# Patient Record
Sex: Female | Born: 1975
Health system: Southern US, Community
[De-identification: ages and names within clinical notes are randomized; demographics above are authoritative.]

## PROBLEM LIST (undated history)

## (undated) DIAGNOSIS — Z8679 Personal history of other diseases of the circulatory system: Secondary | ICD-10-CM

## (undated) DIAGNOSIS — R001 Bradycardia, unspecified: Secondary | ICD-10-CM

## (undated) DIAGNOSIS — K635 Polyp of colon: Secondary | ICD-10-CM

## (undated) DIAGNOSIS — D649 Anemia, unspecified: Secondary | ICD-10-CM

## (undated) DIAGNOSIS — K219 Gastro-esophageal reflux disease without esophagitis: Secondary | ICD-10-CM

## (undated) DIAGNOSIS — Z87898 Personal history of other specified conditions: Secondary | ICD-10-CM

## (undated) DIAGNOSIS — U071 COVID-19: Secondary | ICD-10-CM

## (undated) DIAGNOSIS — M1990 Unspecified osteoarthritis, unspecified site: Secondary | ICD-10-CM

## (undated) DIAGNOSIS — K5790 Diverticulosis of intestine, part unspecified, without perforation or abscess without bleeding: Secondary | ICD-10-CM

## (undated) DIAGNOSIS — B009 Herpesviral infection, unspecified: Secondary | ICD-10-CM

## (undated) DIAGNOSIS — E119 Type 2 diabetes mellitus without complications: Secondary | ICD-10-CM

## (undated) HISTORY — DX: Personal history of other specified conditions: Z87.898

## (undated) HISTORY — DX: Personal history of other diseases of the circulatory system: Z86.79

## (undated) HISTORY — PX: KNEE SURGERY: SHX244

## (undated) HISTORY — PX: KNEE ARTHROSCOPY: SHX127

## (undated) HISTORY — DX: Herpesviral infection, unspecified: B00.9

---

## 2004-06-29 ENCOUNTER — Ambulatory Visit: Payer: Self-pay | Admitting: Rheumatology

## 2004-07-02 ENCOUNTER — Ambulatory Visit: Payer: Self-pay | Admitting: Rheumatology

## 2004-09-07 ENCOUNTER — Emergency Department: Payer: Self-pay | Admitting: Emergency Medicine

## 2004-10-04 ENCOUNTER — Emergency Department: Payer: Self-pay | Admitting: Unknown Physician Specialty

## 2004-10-07 ENCOUNTER — Ambulatory Visit: Payer: Self-pay | Admitting: Gastroenterology

## 2006-06-13 ENCOUNTER — Encounter: Admission: RE | Admit: 2006-06-13 | Discharge: 2006-06-13 | Payer: Self-pay | Admitting: Neurology

## 2006-10-12 IMAGING — CR DG LUMBAR SPINE 2-3V
1 series · 3 of 3 positions shown · non-contrast
Comparison: none

REASON FOR EXAM: fall
COMMENTS:

[Series 1: view not recorded · 0.17mm/px · 3 of 3 slices shown]
[im 1/3]
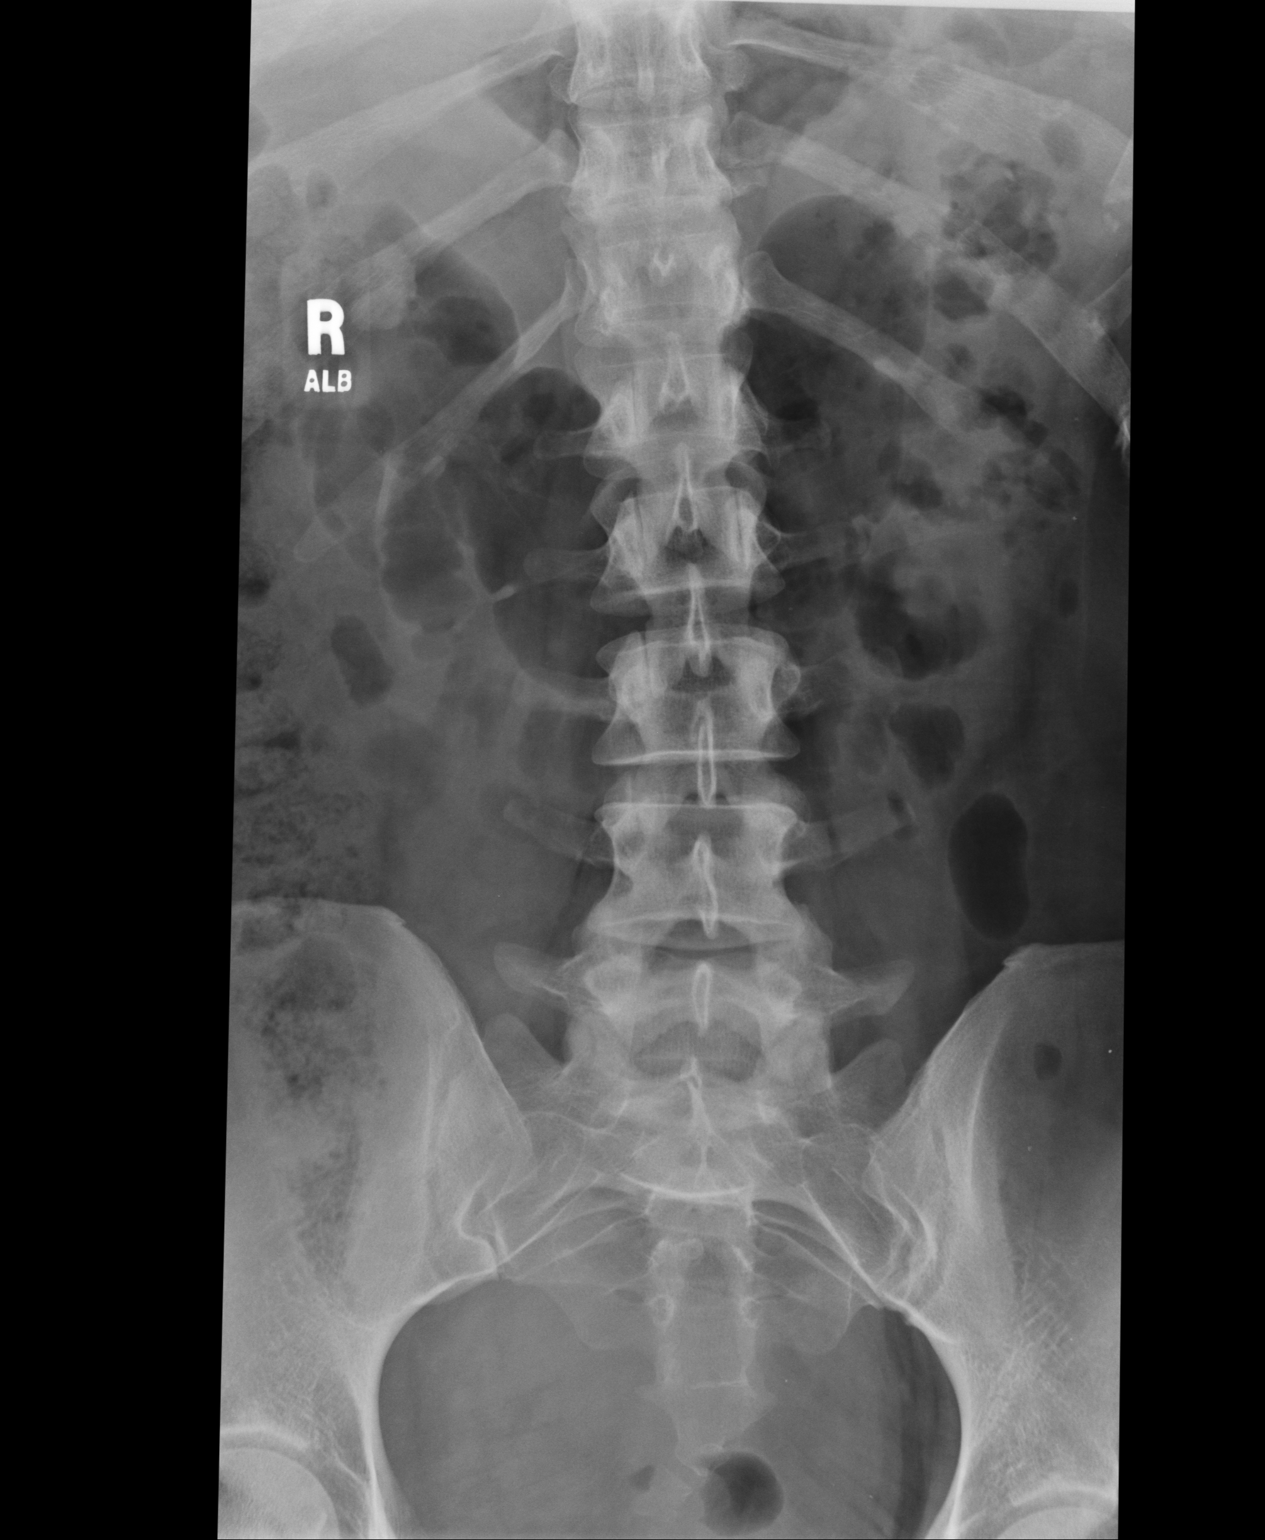
[im 2/3]
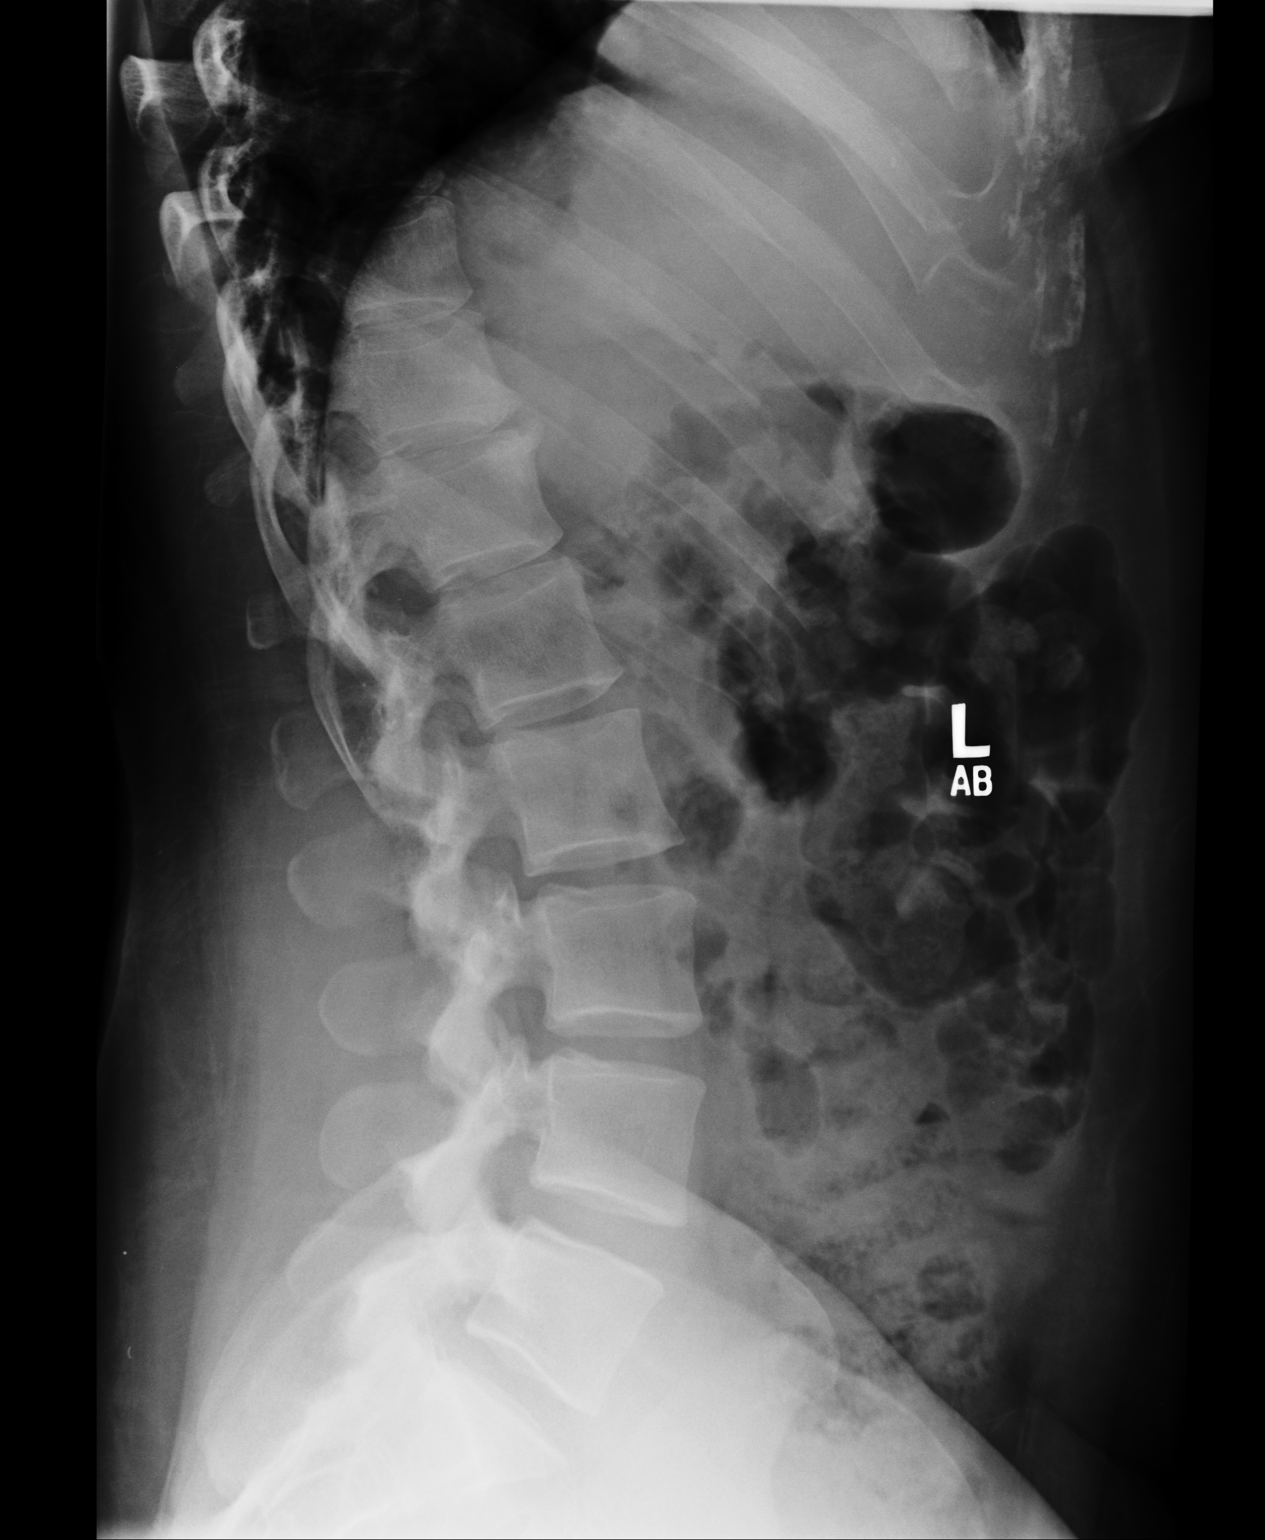
[im 3/3]
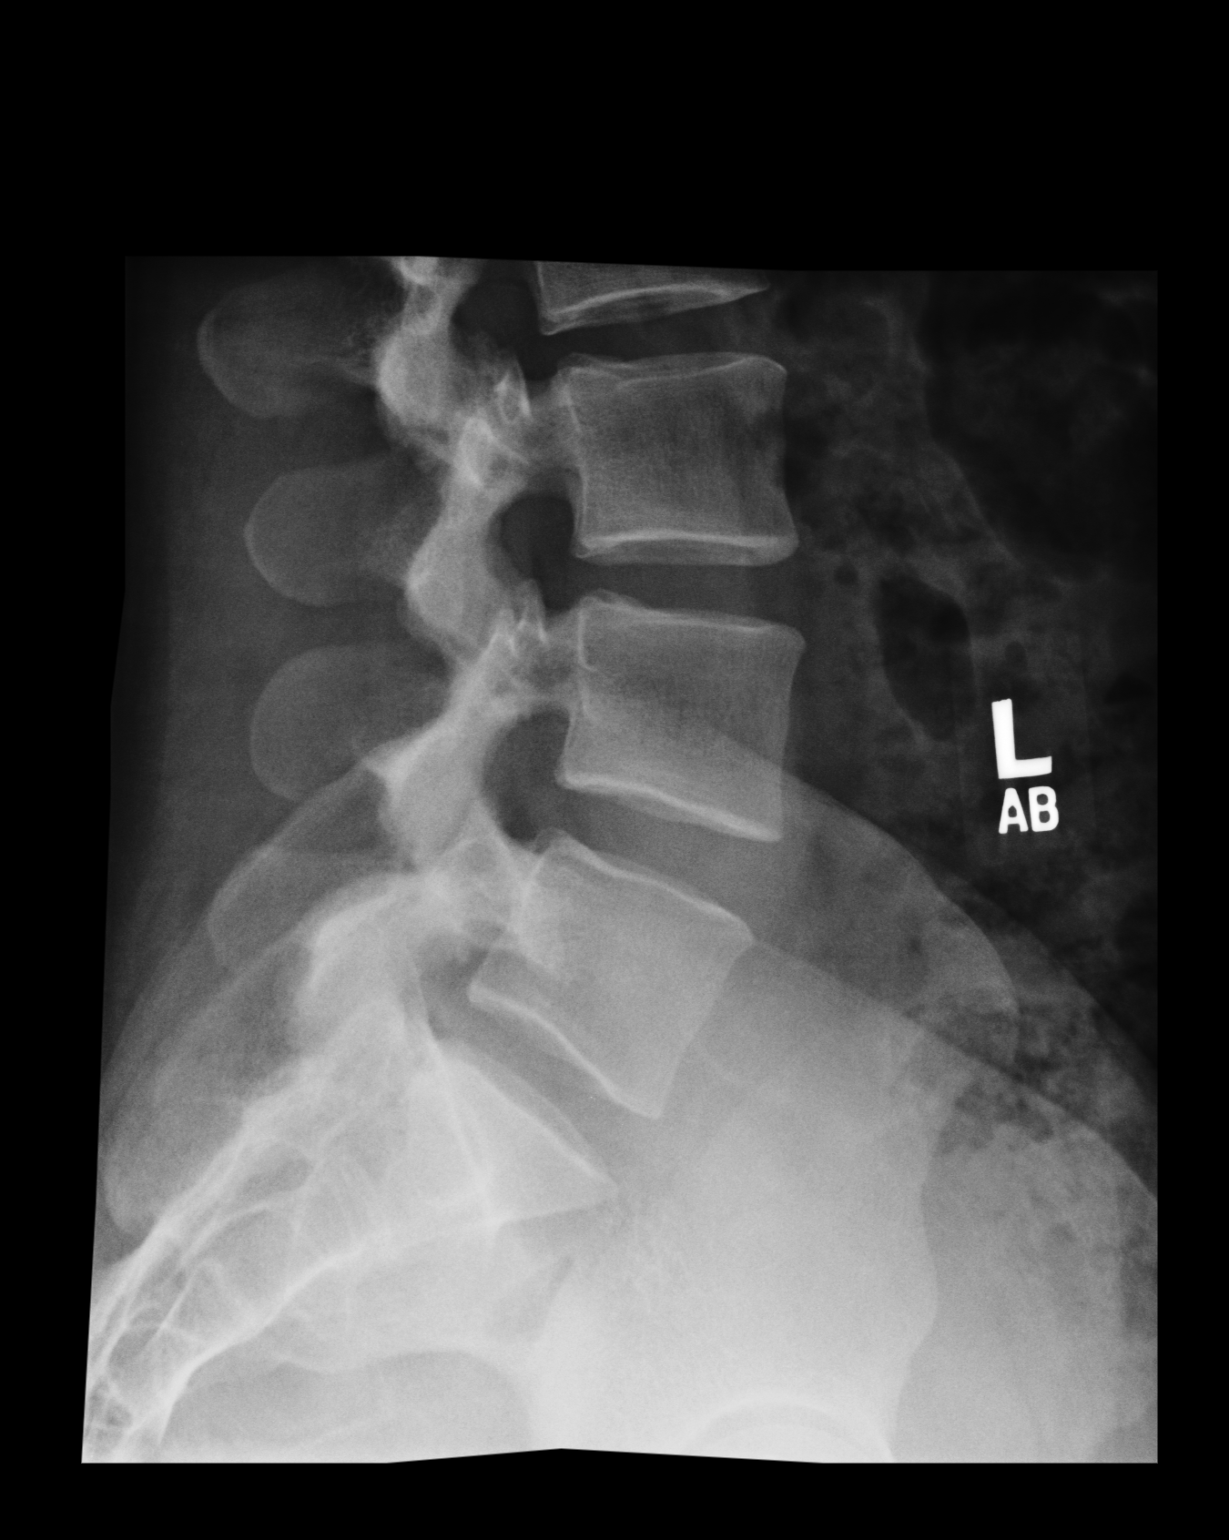

[3 of 3 positions shown; findings below may reference images not displayed]

PROCEDURE:     DXR - DXR LUMBAR SPINE AP AND LATERAL  - October 04, 2004  [DATE]

RESULT:

REASON:  Back pain after a fall.

The lumbar spine aligns anatomically without evidence of fracture, pars
defect or spondylolisthesis.  The intervertebral disc spaces and vertebral
body heights are well maintained. There is no suspicious paraspinal mass.
IMPRESSION: Normal lumbar spine.

## 2006-10-12 IMAGING — CR CERVICAL SPINE - COMPLETE 4+ VIEW
1 series · 9 of 10 positions shown · non-contrast
Comparison: none

REASON FOR EXAM: fall
COMMENTS:

[Series 1: view not recorded · 0.17mm/px · 9 of 11 slices shown]
[im 1/11]
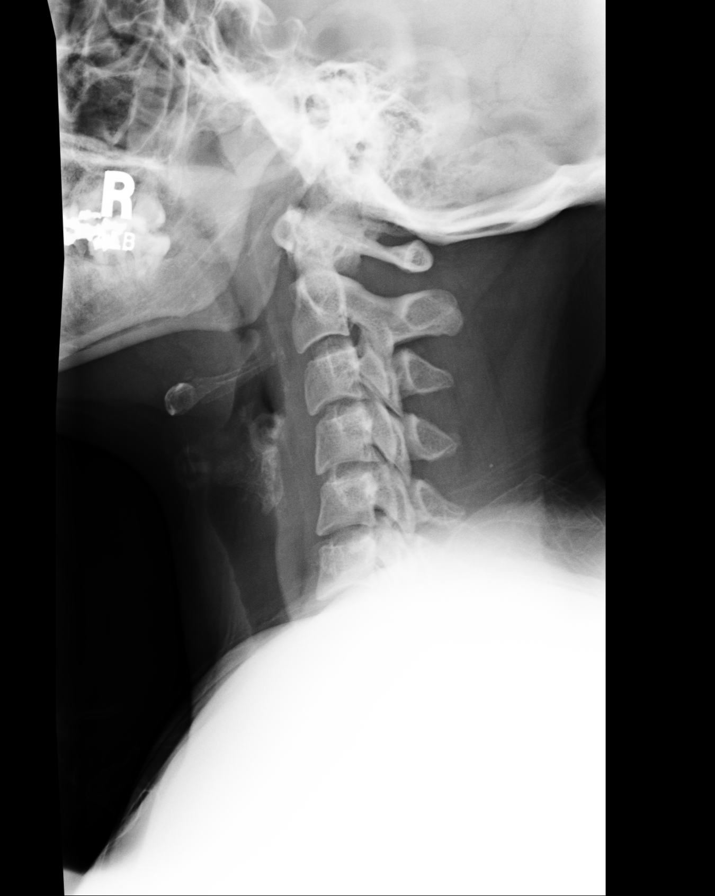
[im 2/11]
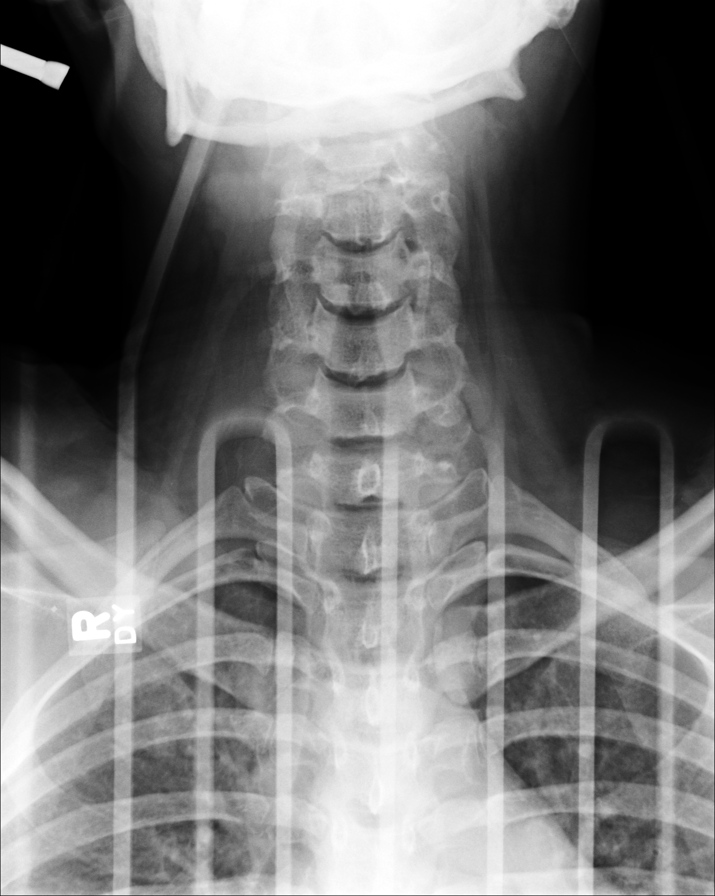
[im 3/11]
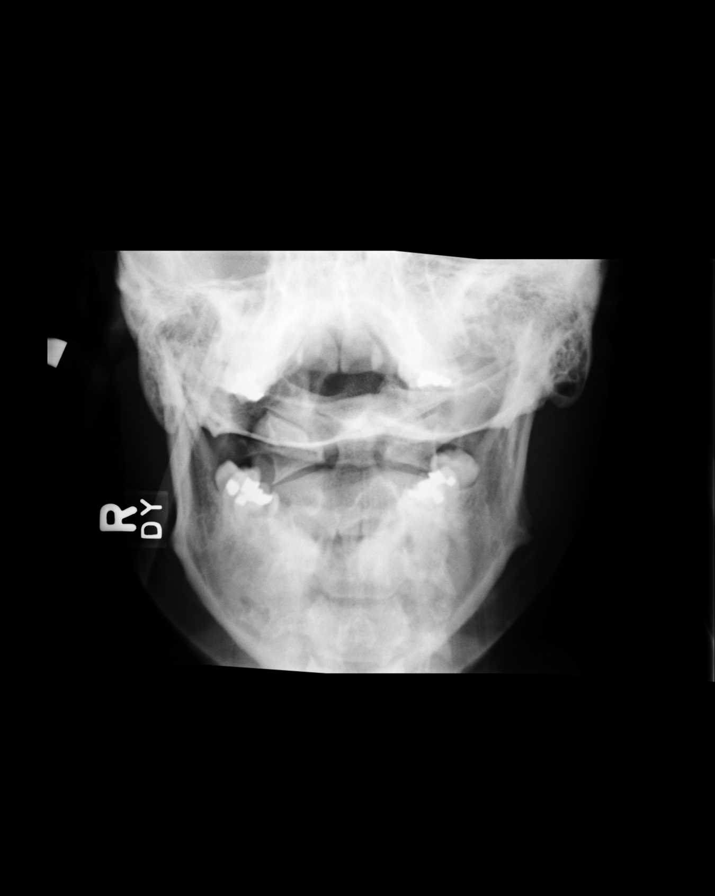
[im 4/11]
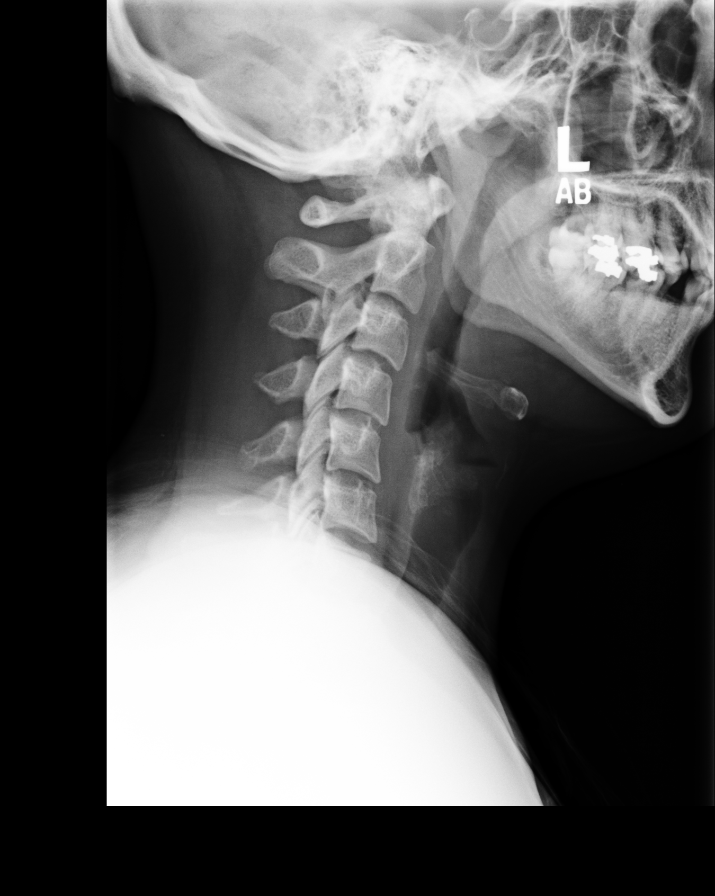
[im 5/11]
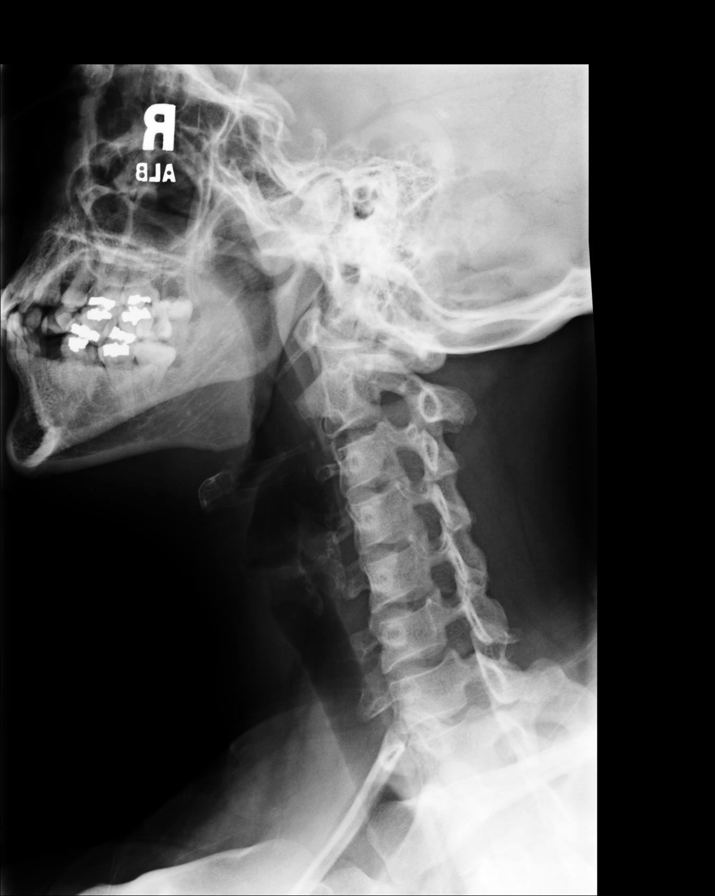
[im 6/11]
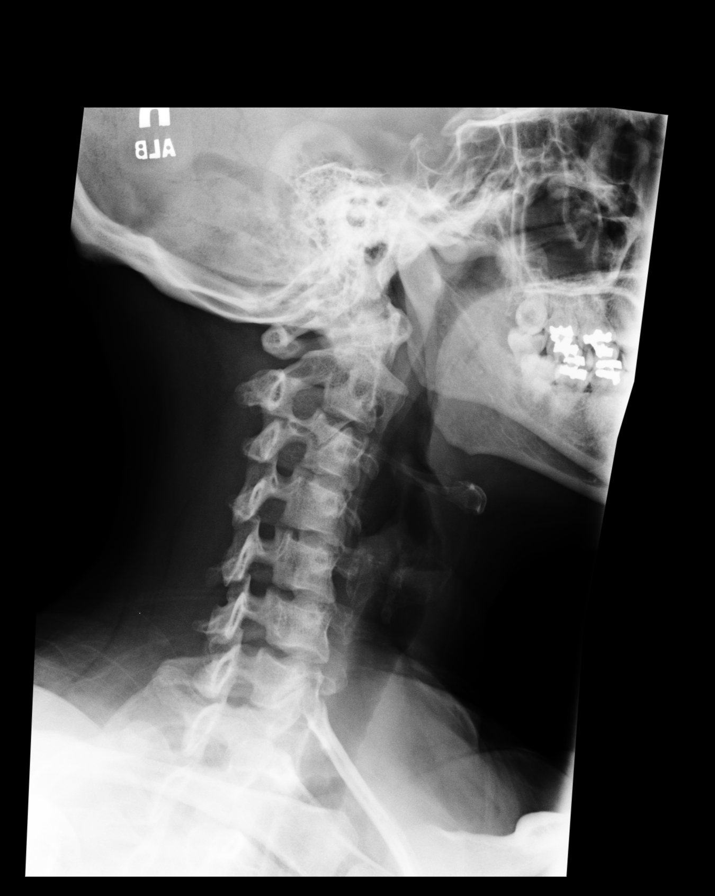
[im 7/11]
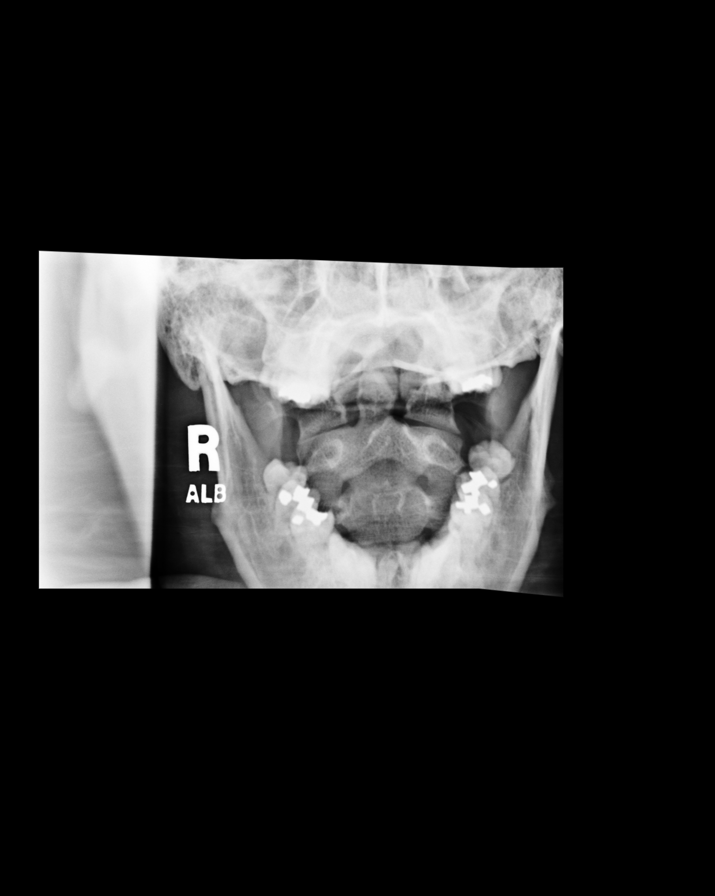
[im 8/11]
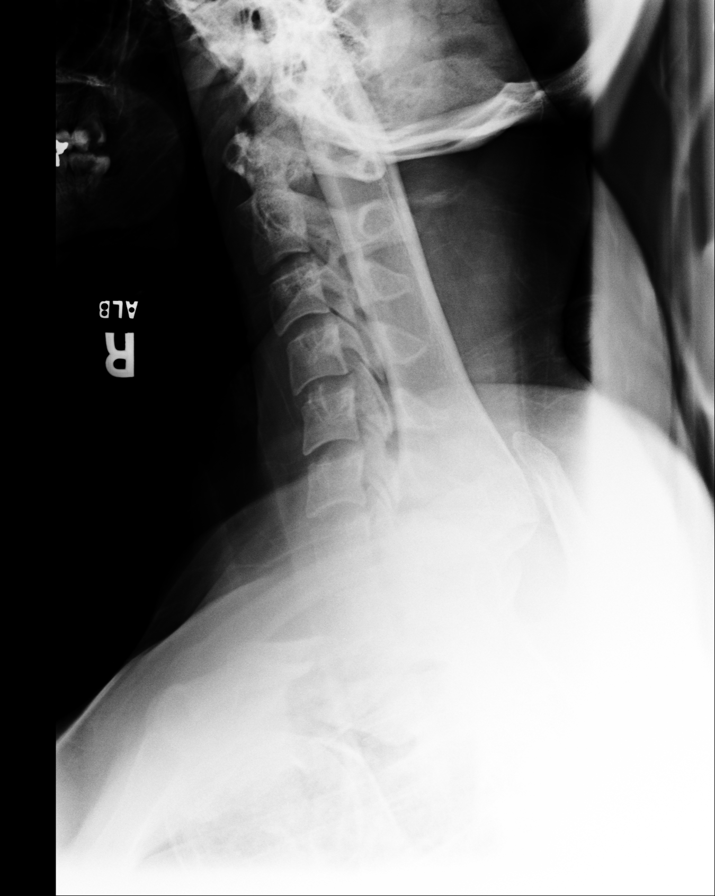
[im 9/11]
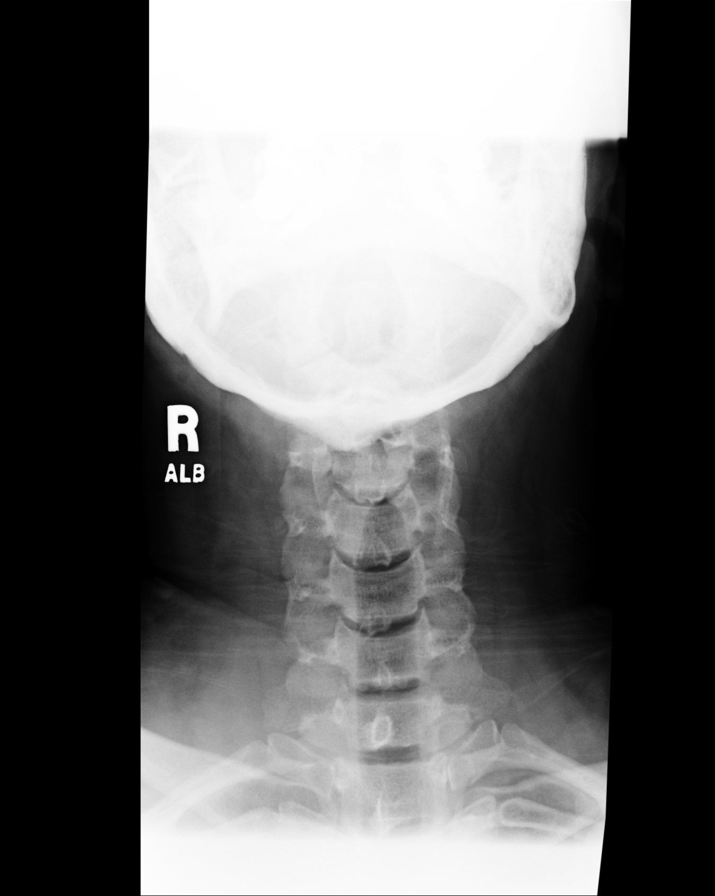

[9 of 10 positions shown; findings below may reference images not displayed]

PROCEDURE:     DXR - DXR C-SPINE COMP TRAUMA W/X-TABL  - October 04, 2004  [DATE]

RESULT:     There is some loss of the natural lordotic curvature of the
cervical spine, which may be positional.  The cervical spine otherwise
aligns anatomically without evidence of fracture. There is a normal
relationship between C7 and T1.  The prevertebral soft tissues and posterior
elements are unremarkable.  The odontoid process is also normal.
IMPRESSION: 1)No acute traumatic abnormality is identified in the cervical spine.

## 2006-10-12 IMAGING — CR DG ANKLE COMPLETE 3+V*L*
1 series · 5 of 5 positions shown · non-contrast
Comparison: none

REASON FOR EXAM: Fall
COMMENTS:

PROCEDURE:     DXR - DXR ANKLE LEFT COMPLETE  - October 04, 2004  [DATE]
RESULT:     Multiple views of the LEFT ankle show no evidence of fracture,
foreign body or soft tissue swelling.

[Series 1: view not recorded · 0.17mm/px · 5 of 5 slices shown]
[im 1/5]
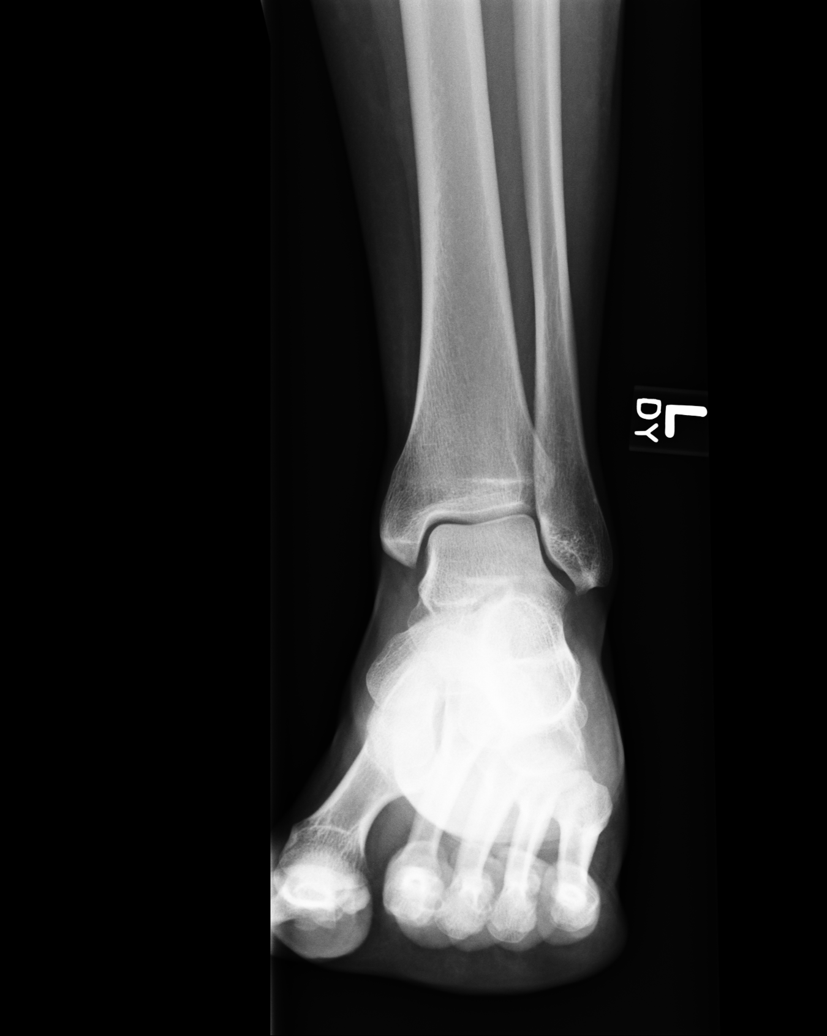
[im 2/5]
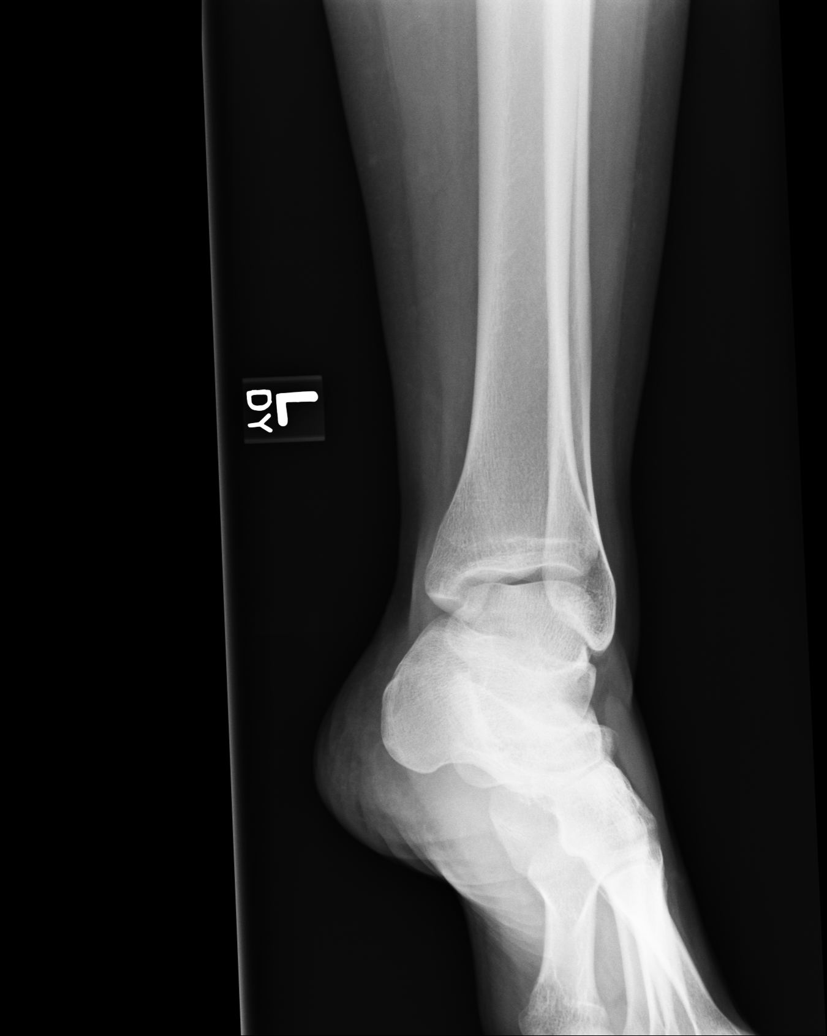
[im 3/5]
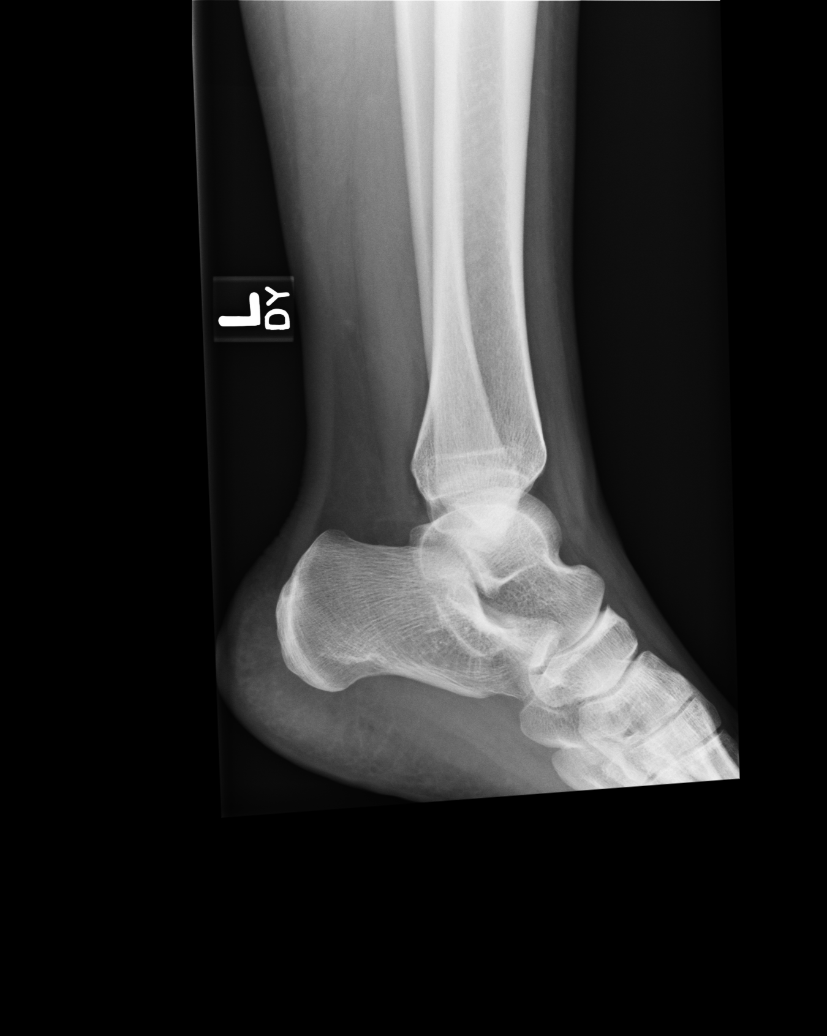
[im 4/5]
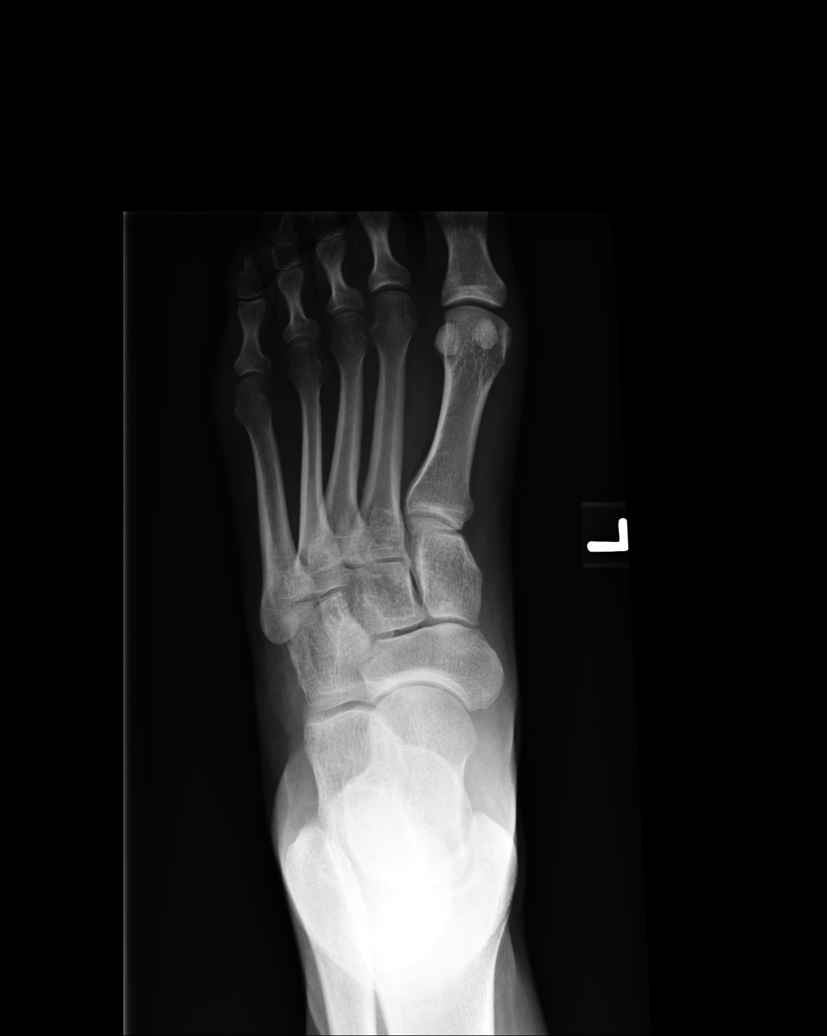
[im 5/5]
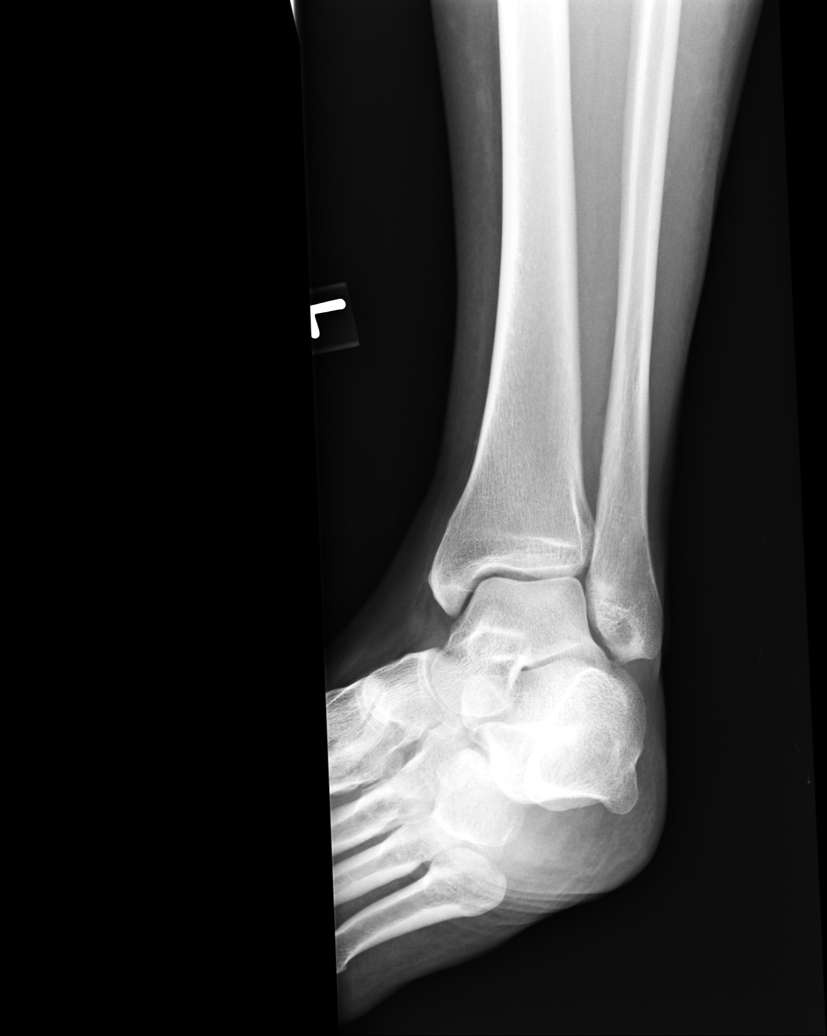

[5 of 5 positions shown; findings below may reference images not displayed]

IMPRESSION: Normal LEFT ankle.

## 2006-10-12 IMAGING — CT CT HEAD WITHOUT CONTRAST
2 series · 16 of 30 positions shown, 20 images · non-contrast
Comparison: none

REASON FOR EXAM: Fell, head pain
COMMENTS:

PROCEDURE:     CT  - CT HEAD WITHOUT CONTRAST  - October 04, 2004  [DATE]
RESULT:     Unenhanced emergent head CT was performed status post trauma.

[Series 2: without · axial · non-contrast · 0.37mm/px · z∈[+954,+1088]mm · 13 of 33 slices shown, 17 images]
[im 3/33  brain]
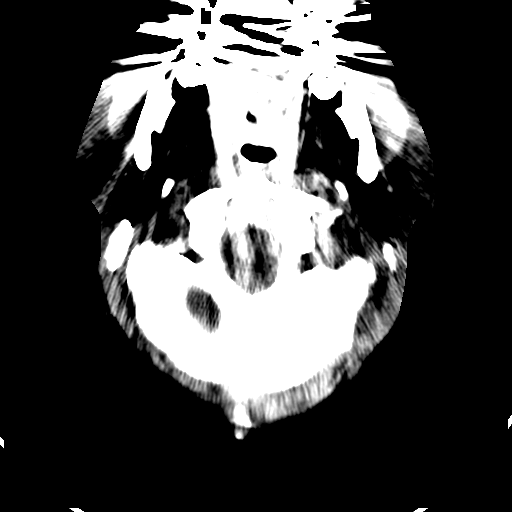
[im 3/33  bone]
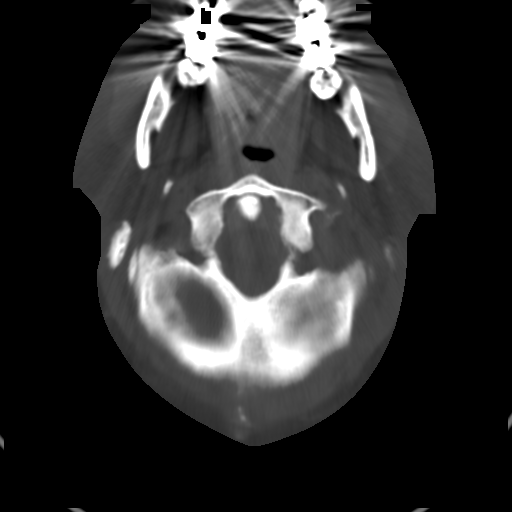
[im 5/33  brain]
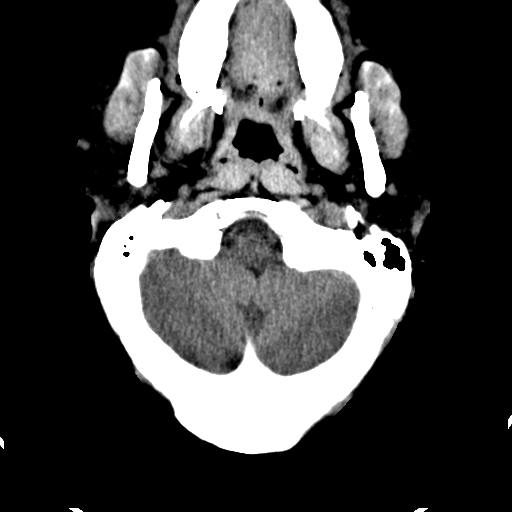
[im 7/33  brain]
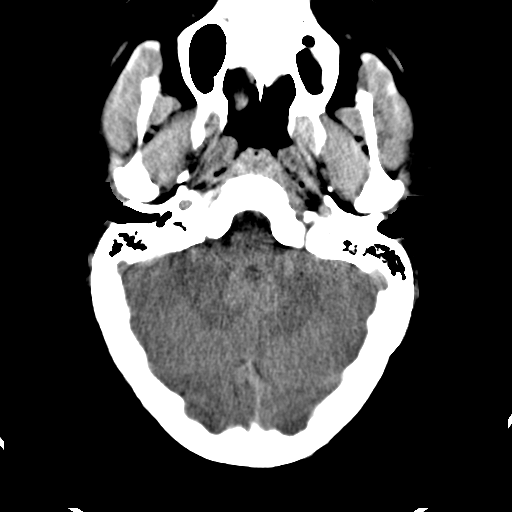
[im 10/33  brain]
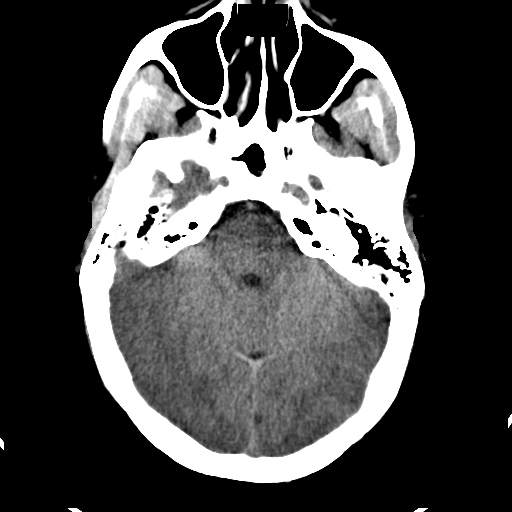
[im 12/33  brain]
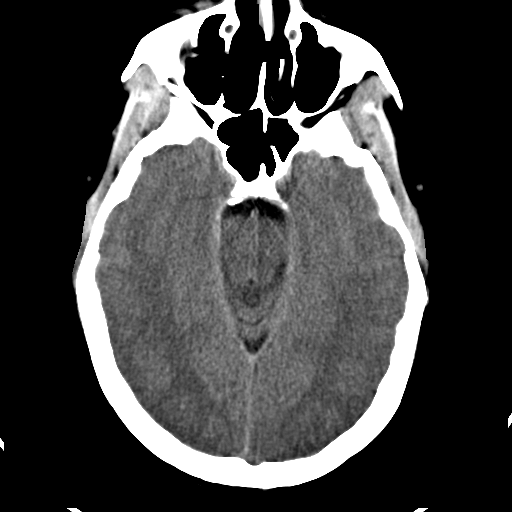
[im 12/33  bone]
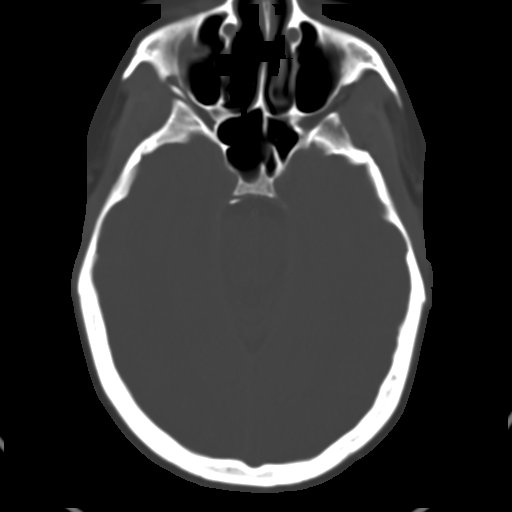
[im 14/33  brain]
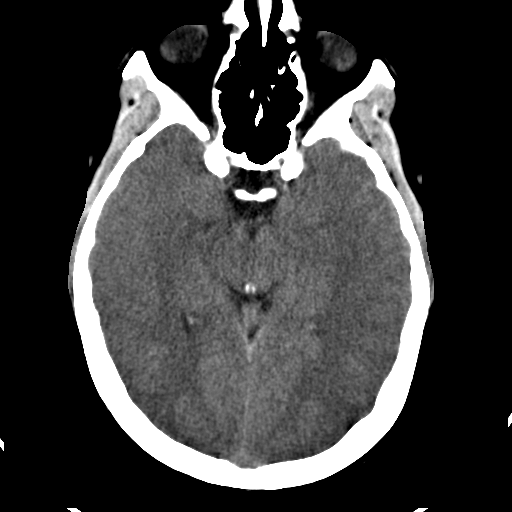
[im 17/33  brain]
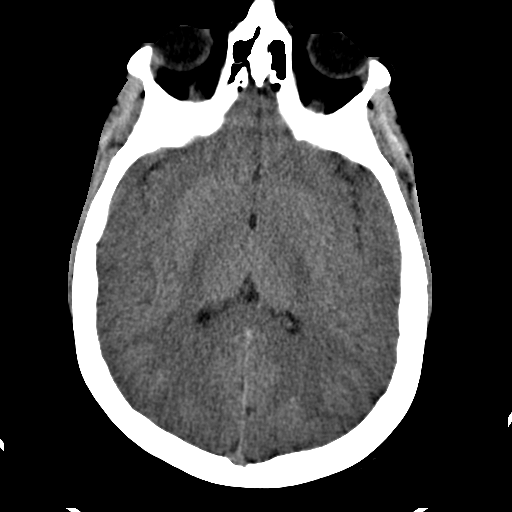
[im 19/33  brain]
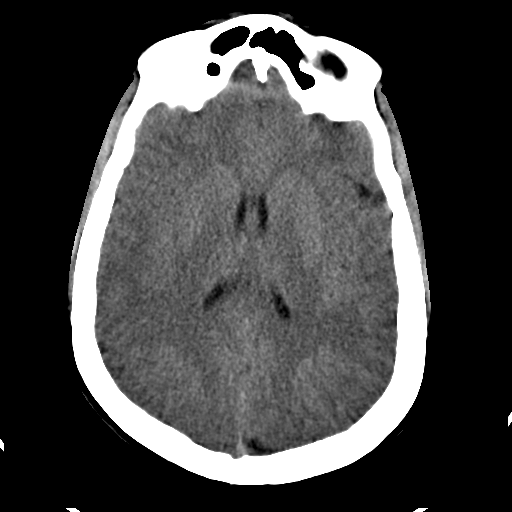
[im 21/33  brain]
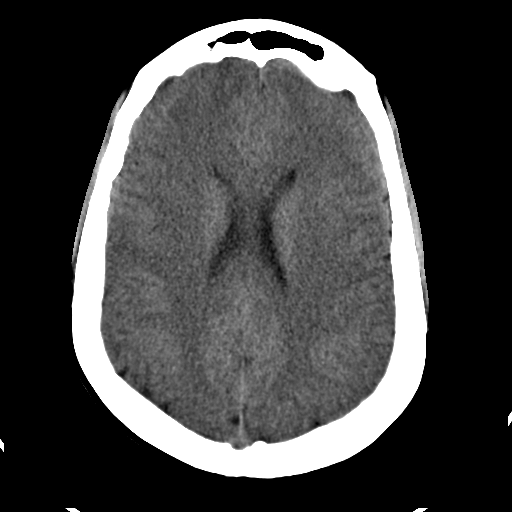
[im 21/33  bone]
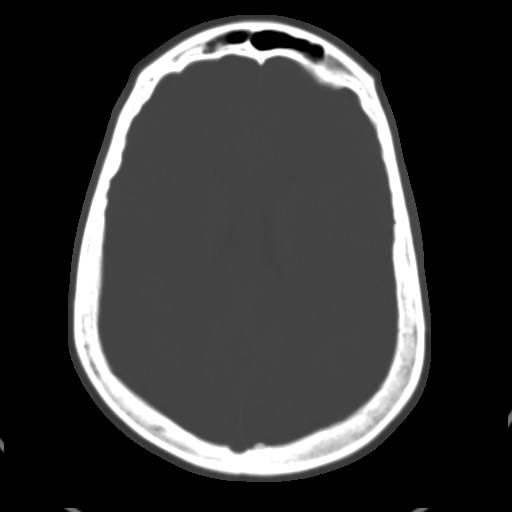
[im 23/33  brain]
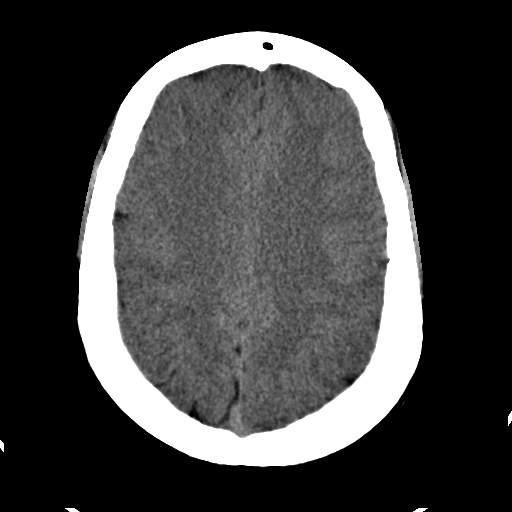
[im 26/33  brain]
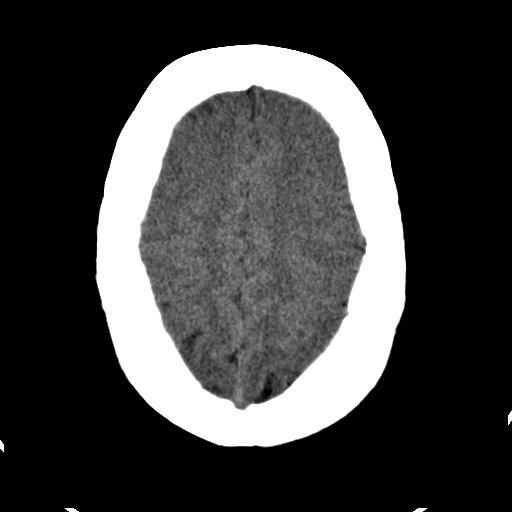
[im 28/33  brain]
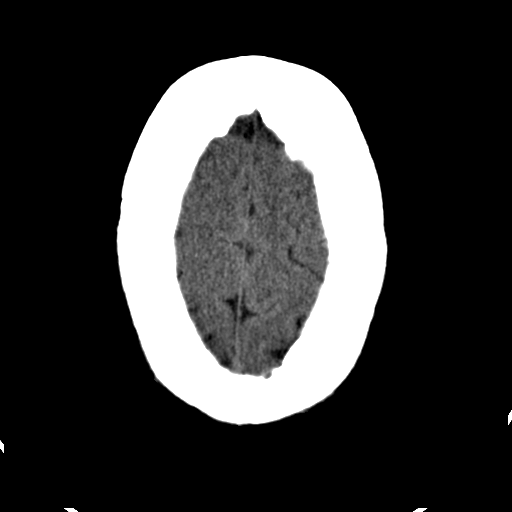
[im 30/33  brain]
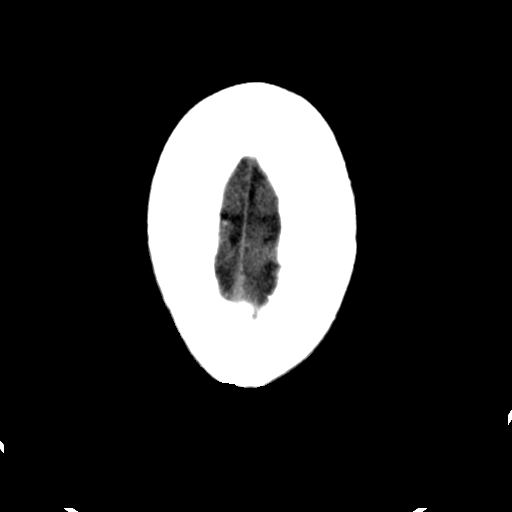
[im 30/33  bone]
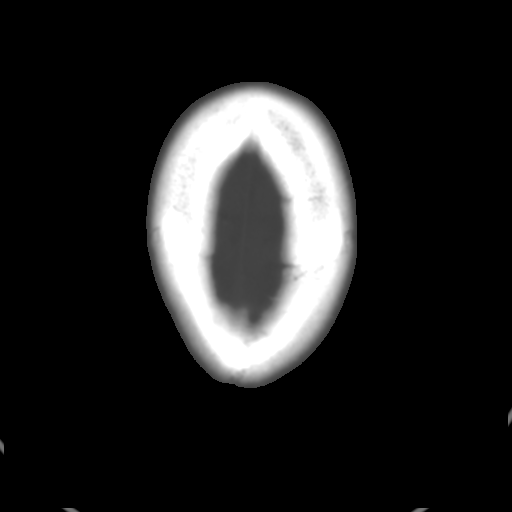

[Series 3: bone windows · axial · 0.39mm/px · z∈[+954,+998]mm · 3 of 33 slices shown]
[im 3/33  bone]
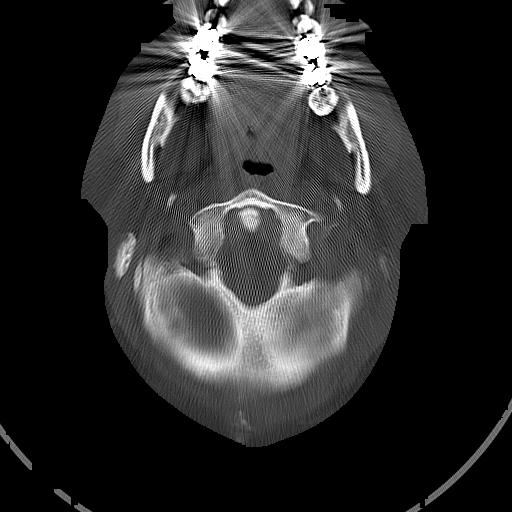
[im 7/33  bone]
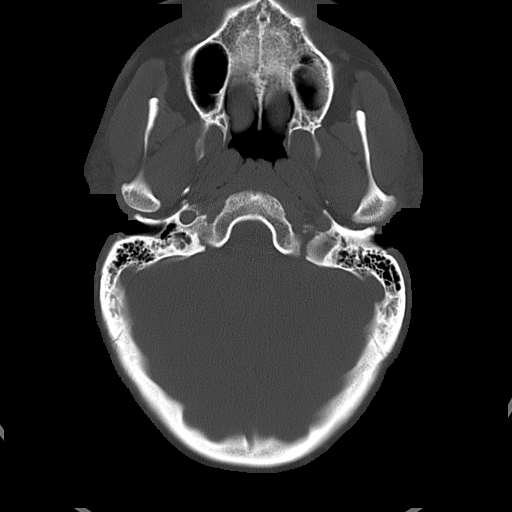
[im 12/33  bone]
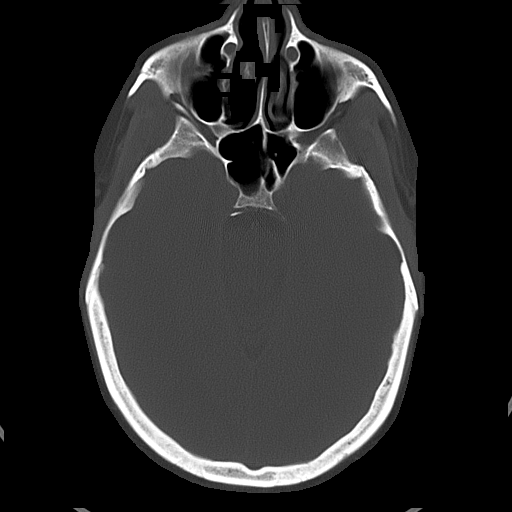

[16 of 30 positions shown; findings below may reference images not displayed]

FINDINGS: No intracerebral bleeds are noted. There is no mass effect. There
is no shift of the midline. No subdural hematomas are seen.

On the bone window settings, no obvious skull fractures are noted.
IMPRESSION: No acute finding is identified on the unenhanced emergent
head CT.

The report was called to the Emergency Room at the conclusion of the study.

## 2010-10-02 ENCOUNTER — Emergency Department: Payer: Self-pay | Admitting: *Deleted

## 2010-10-22 ENCOUNTER — Encounter: Payer: Self-pay | Admitting: Cardiovascular Disease

## 2010-10-22 ENCOUNTER — Ambulatory Visit (INDEPENDENT_AMBULATORY_CARE_PROVIDER_SITE_OTHER): Payer: BC Managed Care – PPO | Admitting: Cardiovascular Disease

## 2010-10-22 DIAGNOSIS — R42 Dizziness and giddiness: Secondary | ICD-10-CM

## 2010-10-22 DIAGNOSIS — R001 Bradycardia, unspecified: Secondary | ICD-10-CM

## 2010-10-22 DIAGNOSIS — I498 Other specified cardiac arrhythmias: Secondary | ICD-10-CM

## 2010-10-22 DIAGNOSIS — R079 Chest pain, unspecified: Secondary | ICD-10-CM

## 2010-10-22 DIAGNOSIS — R5383 Other fatigue: Secondary | ICD-10-CM

## 2010-10-22 DIAGNOSIS — R5381 Other malaise: Secondary | ICD-10-CM

## 2010-10-22 NOTE — Patient Instructions (Addendum)
We will have you wear a holter monitor to evaluate your heart rate in the next 48 hrs We have also scheduled you for a treadmill study. Please call us if you have new issues that need to be addressed.

## 2010-10-23 DIAGNOSIS — R42 Dizziness and giddiness: Secondary | ICD-10-CM | POA: Insufficient documentation

## 2010-10-23 DIAGNOSIS — R001 Bradycardia, unspecified: Secondary | ICD-10-CM | POA: Insufficient documentation

## 2010-10-23 DIAGNOSIS — R0789 Other chest pain: Secondary | ICD-10-CM | POA: Insufficient documentation

## 2010-10-23 DIAGNOSIS — R5383 Other fatigue: Secondary | ICD-10-CM | POA: Insufficient documentation

## 2010-10-23 NOTE — Assessment & Plan Note (Signed)
Uncertain if her fatigue is secondary to bradycardia or some other etiology. She does not sleep well at baseline. She denies sleep apnea.

## 2010-10-23 NOTE — Assessment & Plan Note (Signed)
Etiology of her chest pain is uncertain. We have scheduled her for a treadmill test early next week. Baseline heart rate is slow and we will monitor her chronotropic activity with exercise.

## 2010-10-23 NOTE — Progress Notes (Signed)
   Patient ID: Daisy Ross, female    DOB: 1975-11-13, 35 y.o.   MRN: [id number]  HPI Comments: 35 year old woman with no significant past medical history who typically works out at [employer], presenting with episodes of chest pain and dizziness. She presents for further evaluation.  She reports that over the past 2 weeks, she has had numerous episodes of chest pain, typically associated with exertion. She is able to work out at the gym at baseline now has chest discomfort when she exerts herself. She has had some episodes of chest discomfort at rest though most often with exertion. She describes it as a pressure in her mediastinum radiating to the left, into her shoulder. She does have some of these episodes when she is sexually active.  She's also had episodes of dizziness. Dizziness typically occurs when she stands from a bent over position. She has never been told that her heart rate is low in the past that she has not been seen by physicians on a regular basis.  She did go to the emergency room at [facility] on August 18 for chest pain. EKG was unrevealing, lab work including cardiac enzymes were normal.  EKG shows sinus bradycardia with rate 48 beats per minute with nonspecific ST abnormality in lead 2, 3, aVF  Currently not on medications  Review of Systems  Constitutional: Negative.   HENT: Negative.   Eyes: Negative.   Respiratory: Negative.   Cardiovascular: Positive for chest pain.  Gastrointestinal: Negative.   Musculoskeletal: Negative.   Skin: Negative.   Neurological: Positive for dizziness.  Hematological: Negative.   Psychiatric/Behavioral: Negative.   All other systems reviewed and are negative.    BP 119/83  Pulse 48  Ht 5' 8.5" (1.74 m)  Wt 194 lb (87.998 kg)  BMI 29.07 kg/m2   Physical Exam  Nursing note and vitals reviewed. Constitutional: She is oriented to person, place, and time. She appears well-developed and well-nourished.  HENT:  Head: Normocephalic.    Nose: Nose normal.  Mouth/Throat: Oropharynx is clear and moist.  Eyes: Conjunctivae are normal. Pupils are equal, round, and reactive to light.  Neck: Normal range of motion. Neck supple. No JVD present.  Cardiovascular: Normal rate, regular rhythm, S1 normal, S2 normal, normal heart sounds and intact distal pulses.  Exam reveals no gallop and no friction rub.   No murmur heard. Pulmonary/Chest: Effort normal and breath sounds normal. No respiratory distress. She has no wheezes. She has no rales. She exhibits no tenderness.  Abdominal: Soft. Bowel sounds are normal. She exhibits no distension. There is no tenderness.  Musculoskeletal: Normal range of motion. She exhibits no edema and no tenderness.  Lymphadenopathy:    She has no cervical adenopathy.  Neurological: She is alert and oriented to person, place, and time. Coordination normal.  Skin: Skin is warm and dry. No rash noted. No erythema.  Psychiatric: She has a normal mood and affect. Her behavior is normal. Judgment and thought content normal.         Assessment and Plan

## 2010-10-23 NOTE — Assessment & Plan Note (Signed)
Dizziness typically happens with standing. We have encouraged her to increase her salt intake and fluid intake. She is likely orthostatic with her bradycardia. Blood pressure is low normal.

## 2010-10-23 NOTE — Assessment & Plan Note (Signed)
I suspect that her  Heart rate has been running low on a regular basis. For some reason she is more symptomatic with orthostasis now. Again we will encourage fluids and salt intake appeared we ordered a Holter monitor given she is very symptomatic.

## 2010-10-25 ENCOUNTER — Encounter: Payer: Self-pay | Admitting: Cardiovascular Disease

## 2010-10-25 ENCOUNTER — Ambulatory Visit (INDEPENDENT_AMBULATORY_CARE_PROVIDER_SITE_OTHER): Payer: BC Managed Care – PPO | Admitting: Cardiovascular Disease

## 2010-10-25 DIAGNOSIS — R001 Bradycardia, unspecified: Secondary | ICD-10-CM

## 2010-10-25 DIAGNOSIS — R5383 Other fatigue: Secondary | ICD-10-CM

## 2010-10-25 DIAGNOSIS — I498 Other specified cardiac arrhythmias: Secondary | ICD-10-CM

## 2010-10-25 DIAGNOSIS — R42 Dizziness and giddiness: Secondary | ICD-10-CM

## 2010-10-25 DIAGNOSIS — R079 Chest pain, unspecified: Secondary | ICD-10-CM

## 2010-10-25 NOTE — Progress Notes (Signed)
Exercise Treadmill Test   Treadmill ordered for recent epsiodes of chest pain.  Resting EKG shows NSR with rate of 56 bpm, with no St or T wave ABN Resting blood pressure of 105/73 Stand bruce protocal was used.  Patient exercised for 10 min 35 sec,  Peak heart rate of 171 bpm.  This was 93% of the maximum predicted heart rate (target heart rate 183). Mild symptoms of chest pain at moderate exertion. No  lightheadedness were reported at peak stress or in recovery.  Peak Blood pressure recorded was 160/80 Heart rate at 3 minutes in recovery was 91 bpm.  FINAL IMPRESSION: Normal exercise stress test. No significant EKG changes concerning for ischemia. Excellent exercise tolerance.

## 2010-11-03 ENCOUNTER — Encounter: Payer: BC Managed Care – PPO | Admitting: *Deleted

## 2010-11-08 ENCOUNTER — Telehealth: Payer: Self-pay | Admitting: Cardiovascular Disease

## 2010-11-08 NOTE — Telephone Encounter (Signed)
Rec From [place] ([facility])  11/08/10/km

## 2010-11-08 NOTE — Telephone Encounter (Signed)
Sent to [facility]  11/08/10/km

## 2010-11-11 ENCOUNTER — Telehealth: Payer: Self-pay | Admitting: *Deleted

## 2010-11-11 NOTE — Telephone Encounter (Signed)
Attempted to contact pt with holter monitor results, LMOM TCB. Per Dr. Mariah Milling- NSR with bradycardia while sleeping. Rare APC. She does not have f/u, will discuss with pt upon return call.

## 2010-11-12 NOTE — Telephone Encounter (Signed)
Notified patient of holter monitor results.  Told the patient rare APC.  The patient still having chest pain and would like to follow up with Dr. Mariah Milling.

## 2010-11-12 NOTE — Telephone Encounter (Signed)
Attempted to contact pt, LMOM TCB. She does not have f/u, we were evaluating bradycardia. If she has no c/o do you want her to f/u PRN?

## 2010-11-23 ENCOUNTER — Encounter: Payer: Self-pay | Admitting: Cardiovascular Disease

## 2010-11-23 ENCOUNTER — Ambulatory Visit (INDEPENDENT_AMBULATORY_CARE_PROVIDER_SITE_OTHER): Payer: BC Managed Care – PPO | Admitting: Cardiovascular Disease

## 2010-11-23 VITALS — BP 109/77 | HR 56 | Ht 69.0 in | Wt 192.0 lb

## 2010-11-23 DIAGNOSIS — R42 Dizziness and giddiness: Secondary | ICD-10-CM

## 2010-11-23 DIAGNOSIS — R5381 Other malaise: Secondary | ICD-10-CM

## 2010-11-23 DIAGNOSIS — R5383 Other fatigue: Secondary | ICD-10-CM

## 2010-11-23 DIAGNOSIS — R001 Bradycardia, unspecified: Secondary | ICD-10-CM

## 2010-11-23 DIAGNOSIS — I498 Other specified cardiac arrhythmias: Secondary | ICD-10-CM

## 2010-11-23 DIAGNOSIS — R079 Chest pain, unspecified: Secondary | ICD-10-CM

## 2010-11-23 MED ORDER — FLUDROCORTISONE ACETATE 0.1 MG PO TABS: 0.1000 mg | ORAL_TABLET | Freq: Every day | ORAL | Status: AC

## 2010-11-23 NOTE — Assessment & Plan Note (Signed)
Etiology of her fatigue and headaches is uncertain. Uncertain if she is having poor sleep on a regular basis. Uncertain if there are other issues involved such as stress or depression. Unable to exclude hypotension as the cause of her headaches.

## 2010-11-23 NOTE — Assessment & Plan Note (Signed)
She does have bradycardia and appears to be relatively asymptomatic though her blood pressures also low. We will try the Florinef as mentioned for low blood pressure and symptomatic dizziness.

## 2010-11-23 NOTE — Assessment & Plan Note (Signed)
Chest pain is atypical, short, likely noncardiac given her normal treadmill

## 2010-11-23 NOTE — Assessment & Plan Note (Signed)
Etiology of her dizziness is concerning for hypotension in the setting of bradycardia. Holter does not show a found arrhythmia despite her sensation of something fluttering while wearing the Holter. Symptoms could be secondary to APCs though these were rare. We have suggested that we try to modify her blood pressure to see if we are able to alleviate her dizziness. We will try Florinef 0.1 mg 3 times a week, increasing to daily if needed to push her blood pressure slightly higher.

## 2010-11-23 NOTE — Patient Instructions (Signed)
You are doing well. Please start the florinef three times a week for two weeks and then increase to daily. Monitor your blood pressure and heart rate.  Please call us if you have new issues that need to be addressed before your next appt.  Call us with blood pressure numbers and heart rate numbers before setting up another appt.

## 2010-11-23 NOTE — Progress Notes (Signed)
Patient ID: Daisy Ross, female    DOB: [birth year], 35 y.o.   MRN: [id number]  HPI Comments: 35 year old woman with no significant past medical history who typically works out at [employer], presenting for follow up of episodes of chest pain and dizziness.  She had a recent treadmill study that was essentially normal with good exercise tolerance. She did have a chest discomfort at peak exercise though not significant. She did go to the emergency room at [facility] on August 18 for chest pain. EKG was unrevealing, lab work including cardiac enzymes were normal.  She had a Holter monitor that showed normal sinus rhythm with rare APC.  She reports that she continues to have a fluttering in her chest followed by dizziness. She also has episodes of chest pain. Uncertain if the chest discomfort is secondary to her fluttering. 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 She is quite symptomatic from this. She continues to have long headaches, up to several days at a time. She does not sleep well secondary to her job and sleeps for 3 hours at a time per her report. The  Holter showed that she slept reasonably through the night with heart rates in the 40s, increasing to 60 to 70 at nighttime.  She is able to work out at [employer].  She's also had episodes of dizziness. Dizziness typically occurs when she stands from a bent over position. She has never been told that her heart rate is low in the past that she has not been seen by physicians on a regular basis.    EKG shows sinus bradycardia with rate 61 beats per minute with no significant ST or T wave changes  Outpatient Encounter Prescriptions as of 11/23/2010  Medication Sig Dispense Refill   No on medications   Review of Systems  Constitutional: Negative.   HENT: Negative.   Eyes: Negative.   Respiratory: Negative.   Cardiovascular: Positive for chest pain.  Gastrointestinal: Negative.     Musculoskeletal: Negative.   Skin: Negative.   Neurological: Positive for dizziness.  Hematological: Negative.   Psychiatric/Behavioral: Negative.   All other systems reviewed and are negative.    BP 109/77  Pulse 56  Ht 5\' 9"  (1.753 m)  Wt 192 lb (87.091 kg)  BMI 28.35 kg/m2   Physical Exam  Nursing note and vitals reviewed. Constitutional: She is oriented to person, place, and time. She appears well-developed and well-nourished.  HENT:  Head: Normocephalic.  Nose: Nose normal.  Mouth/Throat: Oropharynx is clear and moist.  Eyes: Conjunctivae are normal. Pupils are equal, round, and reactive to light.  Neck: Normal range of motion. Neck supple. No JVD present.  Cardiovascular: Normal rate, regular rhythm, S1 normal, S2 normal, normal heart sounds and intact distal pulses.  Exam reveals no gallop and no friction rub.   No murmur heard. Pulmonary/Chest: Effort normal and breath sounds normal. No respiratory distress. She has no wheezes. She has no rales. She exhibits no tenderness.  Abdominal: Soft. Bowel sounds are normal. She exhibits no distension. There is no tenderness.  Musculoskeletal: Normal range of motion. She exhibits no edema and no tenderness.  Lymphadenopathy:    She has no cervical adenopathy.  Neurological: She is alert and oriented to person, place, and time. Coordination normal.  Skin: Skin is warm and dry. No rash noted. No erythema.  Psychiatric: She has a normal mood and affect. Her behavior is normal.  Judgment and thought content normal.         Assessment and Plan

## 2011-02-02 ENCOUNTER — Ambulatory Visit: Payer: Self-pay

## 2012-03-06 ENCOUNTER — Emergency Department: Payer: Self-pay | Admitting: Emergency Medicine

## 2012-03-06 LAB — COMPREHENSIVE METABOLIC PANEL
Albumin: 3.8 g/dL (ref 3.4–5.0)
Alkaline Phosphatase: 76 U/L (ref 50–136)
Anion Gap: 5 — ABNORMAL LOW (ref 7–16)
BUN: 12 mg/dL (ref 7–18)
Bilirubin,Total: 0.2 mg/dL (ref 0.2–1.0)
Calcium, Total: 8.7 mg/dL (ref 8.5–10.1)
Chloride: 108 mmol/L — ABNORMAL HIGH (ref 98–107)
Co2: 26 mmol/L (ref 21–32)
Creatinine: 0.71 mg/dL (ref 0.60–1.30)
EGFR (African American): >60
EGFR (Non-African Amer.): >60
Glucose: 93 mg/dL (ref 65–99)
Osmolality: 277 (ref 275–301)
Potassium: 3.7 mmol/L (ref 3.5–5.1)
SGOT(AST): 33 U/L (ref 15–37)
SGPT (ALT): 48 U/L (ref 12–78)
Sodium: 139 mmol/L (ref 136–145)
Total Protein: 8.6 g/dL — ABNORMAL HIGH (ref 6.4–8.2)

## 2012-03-06 LAB — URINALYSIS, COMPLETE
Bacteria: NONE SEEN
Bilirubin,UR: NEGATIVE
Glucose,UR: 150 mg/dL (ref 0–75)
Ketone: NEGATIVE
Leukocyte Esterase: NEGATIVE
Nitrite: NEGATIVE
Ph: 6 (ref 4.5–8.0)
Protein: NEGATIVE
RBC,UR: 1 /HPF (ref 0–5)
Specific Gravity: 1.023 (ref 1.003–1.030)
Squamous Epithelial: 1
WBC UR: 2 /HPF (ref 0–5)

## 2012-03-06 LAB — CBC
HCT: 41 % (ref 35.0–47.0)
HGB: 13.4 g/dL (ref 12.0–16.0)
MCH: 29.1 pg (ref 26.0–34.0)
MCHC: 32.6 g/dL (ref 32.0–36.0)
MCV: 89 fL (ref 80–100)
Platelet: 297 10*3/uL (ref 150–440)
RBC: 4.59 10*6/uL (ref 3.80–5.20)
RDW: 12.7 % (ref 11.5–14.5)
WBC: 7.5 10*3/uL (ref 3.6–11.0)

## 2012-03-06 LAB — LIPASE, BLOOD: Lipase: 157 U/L (ref 73–393)

## 2012-03-06 LAB — PREGNANCY, URINE: Pregnancy Test, Urine: NEGATIVE m[IU]/mL

## 2012-08-15 ENCOUNTER — Encounter: Payer: Self-pay | Admitting: Cardiovascular Disease

## 2012-08-15 ENCOUNTER — Ambulatory Visit (INDEPENDENT_AMBULATORY_CARE_PROVIDER_SITE_OTHER): Payer: BC Managed Care – PPO | Admitting: Cardiovascular Disease

## 2012-08-15 VITALS — BP 132/82 | HR 62 | Ht 68.0 in | Wt 198.5 lb

## 2012-08-15 DIAGNOSIS — I498 Other specified cardiac arrhythmias: Secondary | ICD-10-CM

## 2012-08-15 DIAGNOSIS — Z01810 Encounter for preprocedural cardiovascular examination: Secondary | ICD-10-CM | POA: Insufficient documentation

## 2012-08-15 DIAGNOSIS — R001 Bradycardia, unspecified: Secondary | ICD-10-CM

## 2012-08-15 DIAGNOSIS — R079 Chest pain, unspecified: Secondary | ICD-10-CM

## 2012-08-15 NOTE — Patient Instructions (Addendum)
You are doing well. No medication changes were made.  Please call us if you have new issues that need to be addressed before your next appt.    

## 2012-08-15 NOTE — Assessment & Plan Note (Signed)
Overall she is doing well. No symptoms concerning for new cardiac issues. Rare palpitation. Otherwise stable. No further testing needed. She would be acceptable risk for any orthopedic surgery needed.

## 2012-08-15 NOTE — Progress Notes (Signed)
   Patient ID: Daisy Ross, female    DOB: May 16, 1975, 37 y.o.   MRN: [id number]  HPI Comments: 37 year old woman with no significant past cardiac history, previously evaluated for chest pain and dizziness, who presents for preoperative evaluation prior to knee surgery on the right. She has a history of surgery to her right knee 20 years ago  Previous treadmill study done for chest pain that was essentially normal with good exercise tolerance. She did have a chest discomfort at peak exercise though not significant. She did go to the emergency room at [facility] in August  2012  for chest pain. EKG was unrevealing, lab work including cardiac enzymes were normal.  She had a Holter monitor that showed normal sinus rhythm with rare APC.  She does have occasional palpitations. No significant chest pain.   Previous  Holter showed that when she slept, she had heart rates in the 40s, increasing to 60 to 70 while awake She is able to work out at [employer], limited by her knee pain Recently seen by [facility] orthopedics for her knee  EKG shows normal sinus rhythm with rate 62 beats per minute beats per minute with no significant ST or T wave changes  No outpatient encounter prescriptions on file as of 08/15/2012.      Review of Systems  Constitutional: Negative.   HENT: Negative.   Eyes: Negative.   Respiratory: Negative.   Gastrointestinal: Negative.   Musculoskeletal: Positive for arthralgias.  Skin: Negative.   Psychiatric/Behavioral: Negative.   All other systems reviewed and are negative.    BP 132/82  Pulse 62  Ht 5\' 8"  (1.727 m)  Wt 198 lb 8 oz (90.039 kg)  BMI 30.19 kg/m2  Physical Exam  Nursing note and vitals reviewed. Constitutional: She is oriented to person, place, and time. She appears well-developed and well-nourished.  HENT:  Head: Normocephalic.  Nose: Nose normal.  Mouth/Throat: Oropharynx is clear and moist.  Eyes: Conjunctivae are normal. Pupils are equal, round, and  reactive to light.  Neck: Normal range of motion. Neck supple. No JVD present.  Cardiovascular: Normal rate, regular rhythm, S1 normal, S2 normal, normal heart sounds and intact distal pulses.  Exam reveals no gallop and no friction rub.   No murmur heard. Pulmonary/Chest: Effort normal and breath sounds normal. No respiratory distress. She has no wheezes. She has no rales. She exhibits no tenderness.  Abdominal: Soft. Bowel sounds are normal. She exhibits no distension. There is no tenderness.  Musculoskeletal: Normal range of motion. She exhibits no edema and no tenderness.  Lymphadenopathy:    She has no cervical adenopathy.  Neurological: She is alert and oriented to person, place, and time. Coordination normal.  Skin: Skin is warm and dry. No rash noted. No erythema.  Psychiatric: She has a normal mood and affect. Her behavior is normal. Judgment and thought content normal.    Assessment and Plan

## 2012-11-01 ENCOUNTER — Ambulatory Visit: Payer: Self-pay | Admitting: Medical

## 2012-12-10 ENCOUNTER — Ambulatory Visit: Payer: Self-pay | Admitting: *Deleted

## 2012-12-10 ENCOUNTER — Encounter: Payer: Self-pay | Admitting: *Deleted

## 2012-12-10 ENCOUNTER — Ambulatory Visit (INDEPENDENT_AMBULATORY_CARE_PROVIDER_SITE_OTHER): Payer: BC Managed Care – PPO | Admitting: *Deleted

## 2012-12-10 ENCOUNTER — Telehealth: Payer: Self-pay | Admitting: *Deleted

## 2012-12-10 VITALS — BP 110/60 | HR 47 | Ht 69.0 in | Wt 201.0 lb

## 2012-12-10 DIAGNOSIS — R079 Chest pain, unspecified: Secondary | ICD-10-CM

## 2012-12-10 DIAGNOSIS — I951 Orthostatic hypotension: Secondary | ICD-10-CM

## 2012-12-10 MED ORDER — SODIUM CHLORIDE 1 G PO TABS: ORAL_TABLET | ORAL | Status: DC

## 2012-12-10 NOTE — Telephone Encounter (Signed)
Dr. Mariah Milling reviewed the patient's chart. He feels symptoms are not cardiac in nature. We can offer the patient to come in for an EKG or he can do a work in office visit at 4:00 pm today or a GXT. Sherri Rad, RN, BSN  Reviewed with the patient. She will come for an EKG today. Will review with Dr. Mariah Milling once this is done. Sherri Rad, RN, BSN

## 2012-12-10 NOTE — Telephone Encounter (Signed)
The patient called the office this morning complaining of intermittent chest pain that started about 3:00 am this morning. She did not sleep last night. She is seeing the nurse at her work. They checked her BP this morning and could not get it to register. She ate 2 packs of salt and her BP is now 120/90. They have not checked her heart rate. She is nauseated after eating the salt. She complains of a headache. She states her left arm hearts intermittently, but she was laying on that side last night and is not sure if what she is feeling her related to her positioning last night. The patient is requesting to be seen. I advised I would speak with Dr. Mariah Milling when he comes out of a patient room and call her back. She is agreeable. Contact # confirmed- [phone].

## 2012-12-10 NOTE — Patient Instructions (Signed)
Your physician has recommended you make the following change in your medication:  Restart salt tabs once daily  Increase your dietary salt and fluid intake

## 2013-08-27 DIAGNOSIS — R945 Abnormal results of liver function studies: Secondary | ICD-10-CM

## 2013-08-27 DIAGNOSIS — E119 Type 2 diabetes mellitus without complications: Secondary | ICD-10-CM | POA: Insufficient documentation

## 2013-08-27 DIAGNOSIS — R7989 Other specified abnormal findings of blood chemistry: Secondary | ICD-10-CM | POA: Insufficient documentation

## 2013-08-27 DIAGNOSIS — R7303 Prediabetes: Secondary | ICD-10-CM | POA: Insufficient documentation

## 2013-09-17 HISTORY — PX: ESOPHAGOGASTRODUODENOSCOPY: SHX1529

## 2014-01-02 ENCOUNTER — Emergency Department: Payer: Self-pay | Admitting: Emergency Medicine

## 2014-01-02 LAB — URINALYSIS, COMPLETE
Bilirubin,UR: NEGATIVE
Glucose,UR: NEGATIVE mg/dL (ref 0–75)
Ketone: NEGATIVE
Nitrite: NEGATIVE
Ph: 8 (ref 4.5–8.0)
Protein: 100
RBC,UR: 27 /HPF (ref 0–5)
Specific Gravity: 1.002 (ref 1.003–1.030)
Squamous Epithelial: 1
WBC UR: 118 /HPF (ref 0–5)

## 2014-01-02 LAB — CBC WITH DIFFERENTIAL/PLATELET
Basophil #: 0.1 10*3/uL (ref 0.0–0.1)
Basophil %: 0.9 %
Eosinophil #: 0 10*3/uL (ref 0.0–0.7)
Eosinophil %: 0.4 %
HCT: 41.2 % (ref 35.0–47.0)
HGB: 13.4 g/dL (ref 12.0–16.0)
Lymphocyte #: 2.1 10*3/uL (ref 1.0–3.6)
Lymphocyte %: 34.4 %
MCH: 29.8 pg (ref 26.0–34.0)
MCHC: 32.6 g/dL (ref 32.0–36.0)
MCV: 91 fL (ref 80–100)
Monocyte #: 0.5 x10 3/mm (ref 0.2–0.9)
Monocyte %: 8.8 %
Neutrophil #: 3.4 10*3/uL (ref 1.4–6.5)
Neutrophil %: 55.5 %
Platelet: 262 10*3/uL (ref 150–440)
RBC: 4.51 10*6/uL (ref 3.80–5.20)
RDW: 12.7 % (ref 11.5–14.5)
WBC: 6.2 10*3/uL (ref 3.6–11.0)

## 2014-01-02 LAB — COMPREHENSIVE METABOLIC PANEL
Albumin: 3.7 g/dL (ref 3.4–5.0)
Alkaline Phosphatase: 83 U/L
Anion Gap: 5 — ABNORMAL LOW (ref 7–16)
BUN: 10 mg/dL (ref 7–18)
Bilirubin,Total: 0.3 mg/dL (ref 0.2–1.0)
Calcium, Total: 8.8 mg/dL (ref 8.5–10.1)
Chloride: 107 mmol/L (ref 98–107)
Co2: 28 mmol/L (ref 21–32)
Creatinine: 0.75 mg/dL (ref 0.60–1.30)
EGFR (African American): >60
EGFR (Non-African Amer.): >60
Glucose: 85 mg/dL (ref 65–99)
Osmolality: 278 (ref 275–301)
Potassium: 3.7 mmol/L (ref 3.5–5.1)
SGOT(AST): 67 U/L — ABNORMAL HIGH (ref 15–37)
SGPT (ALT): 108 U/L — ABNORMAL HIGH
Sodium: 140 mmol/L (ref 136–145)
Total Protein: 8.2 g/dL (ref 6.4–8.2)

## 2014-01-21 ENCOUNTER — Ambulatory Visit: Payer: Self-pay | Admitting: Gastroenterology

## 2014-01-21 HISTORY — PX: COLONOSCOPY: SHX174

## 2014-03-27 ENCOUNTER — Emergency Department: Payer: Self-pay | Admitting: Emergency Medicine

## 2014-03-27 ENCOUNTER — Telehealth: Payer: Self-pay

## 2014-03-27 NOTE — Telephone Encounter (Signed)
Pt was in ED with CP needs follow up per Dr. Fletcher Anon.

## 2014-03-27 NOTE — Telephone Encounter (Signed)
Spoke w/ pt.  She reports sharp chest pain at rest, though some pain is always present for some time.  Reports chronic SOB, worsening to the point that she cannot sing an entire song.  Pt seen in ED, but does not know her diagnosis, other then EKG changes.  Pt sched to see Dr. Rockey Situ 03/31/14 @ 11:00, but understands to call 911 if sx become emergent before that time.

## 2014-03-31 ENCOUNTER — Encounter: Payer: Self-pay | Admitting: Cardiovascular Disease

## 2014-03-31 ENCOUNTER — Ambulatory Visit (INDEPENDENT_AMBULATORY_CARE_PROVIDER_SITE_OTHER): Payer: BLUE CROSS/BLUE SHIELD | Admitting: Cardiovascular Disease

## 2014-03-31 ENCOUNTER — Other Ambulatory Visit (INDEPENDENT_AMBULATORY_CARE_PROVIDER_SITE_OTHER): Payer: BLUE CROSS/BLUE SHIELD

## 2014-03-31 ENCOUNTER — Other Ambulatory Visit: Payer: Self-pay

## 2014-03-31 VITALS — BP 104/70 | HR 60 | Ht 69.0 in | Wt 201.0 lb

## 2014-03-31 DIAGNOSIS — R079 Chest pain, unspecified: Secondary | ICD-10-CM

## 2014-03-31 DIAGNOSIS — R0789 Other chest pain: Secondary | ICD-10-CM

## 2014-03-31 DIAGNOSIS — R001 Bradycardia, unspecified: Secondary | ICD-10-CM

## 2014-03-31 NOTE — Assessment & Plan Note (Signed)
Etiology of her chest pain is unclear. She has had EGD, colonoscopy, gallbladder ultrasound. Previous stress test several years ago did not show ischemia. No significant risk factors for coronary artery disease. Symptoms are very short lasting. We have ordered echocardiogram to rule out structural heart disease. If this is essentially normal, could consider course of NSAIDs. Could also consider chiropractic for possible rib pain. She is hoping to avoid muscle relaxers as she has to work in the daytime. Lastly, chest CT scan could be ordered if symptoms persist

## 2014-03-31 NOTE — Assessment & Plan Note (Signed)
Previous bradycardia noted on Holter monitor. She has been relatively asymptomatic

## 2014-03-31 NOTE — Progress Notes (Signed)
Patient ID: Daisy Ross, female    DOB: February 18, 1975, 39 y.o.   MRN: [id number]  HPI Comments: 39 year old woman with no significant past cardiac history, previously evaluated for chest pain and dizziness, history of knee surgery 2, chronic chest pain who presents for evaluation of her chest pain symptoms She has a history of surgery to her right knee 20 years ago Previous treadmill stress test in 2012 which was normal  In follow today, she reports that she continues to have stabbing chest pain on the left side. As workup she has had gallbladder ultrasound, EGD, colonoscopy. These were normal by her report She describes the pain as happening during the daytime and nighttime, sharp, knifelike on the left side underneath the left breast radiating upwards lasting for a second or more at a time course for resolving. Sometimes some improvement with palpation. Occasional worsening with deep inspiration. She is concerned about cardiac etiology. She's not very active at baseline secondary to her knee and has not been going to the gym. Typically does not find left side chest pain with exertion. She does have symptoms in the daytime as well as night, no positional component.  EKG on today's visit shows normal sinus rhythm with rate 60 bpm, no significant ST or T-wave changes  Other past medical history Previous treadmill study done for chest pain that was essentially normal with good exercise tolerance. She did have a chest discomfort at peak exercise though not significant. She did go to the emergency room at [facility] in August  2012  for chest pain. EKG was unrevealing, lab work including cardiac enzymes were normal. She had a Holter monitor that showed normal sinus rhythm with rare APC.   heart rates in the 40s, increasing to 60 to 70 while awake  She is able to work out at [employer], limited by her knee pain  No Known Allergies  Outpatient Encounter Prescriptions as of 03/31/2014  Medication Sig  .  sodium chloride 1 G tablet Take one tablet by mouth twice daily    History reviewed. No pertinent past medical history.  Past Surgical History  Procedure Laterality Date  . Knee surgery      Social History  reports that she has never smoked. She does not have any smokeless tobacco history on file. She reports that she drinks about 0.5 oz of alcohol per week. She reports that she does not use illicit drugs.  Family History Family history is unknown by patient.   Review of Systems  Constitutional: Negative.   HENT: Negative.   Respiratory: Negative.   Cardiovascular: Negative.   Gastrointestinal: Negative.   Musculoskeletal: Positive for arthralgias.  Skin: Negative.   Neurological: Negative.   Hematological: Negative.   Psychiatric/Behavioral: Negative.   All other systems reviewed and are negative.   BP 104/70 mmHg  Pulse 60  Ht 5\' 9"  (1.753 m)  Wt 201 lb (91.173 kg)  BMI 29.67 kg/m2  Physical Exam  Constitutional: She is oriented to person, place, and time. She appears well-developed and well-nourished.  HENT:  Head: Normocephalic.  Nose: Nose normal.  Mouth/Throat: Oropharynx is clear and moist.  Eyes: Conjunctivae are normal. Pupils are equal, round, and reactive to light.  Neck: Normal range of motion. Neck supple. No JVD present.  Cardiovascular: Normal rate, regular rhythm, S1 normal, S2 normal, normal heart sounds and intact distal pulses.  Exam reveals no gallop and no friction rub.   No murmur heard. Pulmonary/Chest: Effort normal and breath sounds normal.  No respiratory distress. She has no wheezes. She has no rales. She exhibits no tenderness.  Abdominal: Soft. Bowel sounds are normal. She exhibits no distension. There is no tenderness.  Musculoskeletal: Normal range of motion. She exhibits no edema or tenderness.  Lymphadenopathy:    She has no cervical adenopathy.  Neurological: She is alert and oriented to person, place, and time. Coordination normal.   Skin: Skin is warm and dry. No rash noted. No erythema.  Psychiatric: She has a normal mood and affect. Her behavior is normal. Judgment and thought content normal.    Assessment and Plan  Nursing note and vitals reviewed.

## 2014-03-31 NOTE — Patient Instructions (Addendum)
You are doing well. No medication changes were made.  We will perform an echocardiogram today for chest pain   Please call us if you have new issues that need to be addressed before your next appt.

## 2014-06-09 LAB — SURGICAL PATHOLOGY

## 2016-04-03 IMAGING — CR DG CHEST 1V PORT
1 series · 1 of 1 positions shown · non-contrast
Comparison: 10/02/2010

CLINICAL DATA: Left-sided chest pain and shortness of breath.

EXAM:
PORTABLE CHEST - 1 VIEW

[ap]
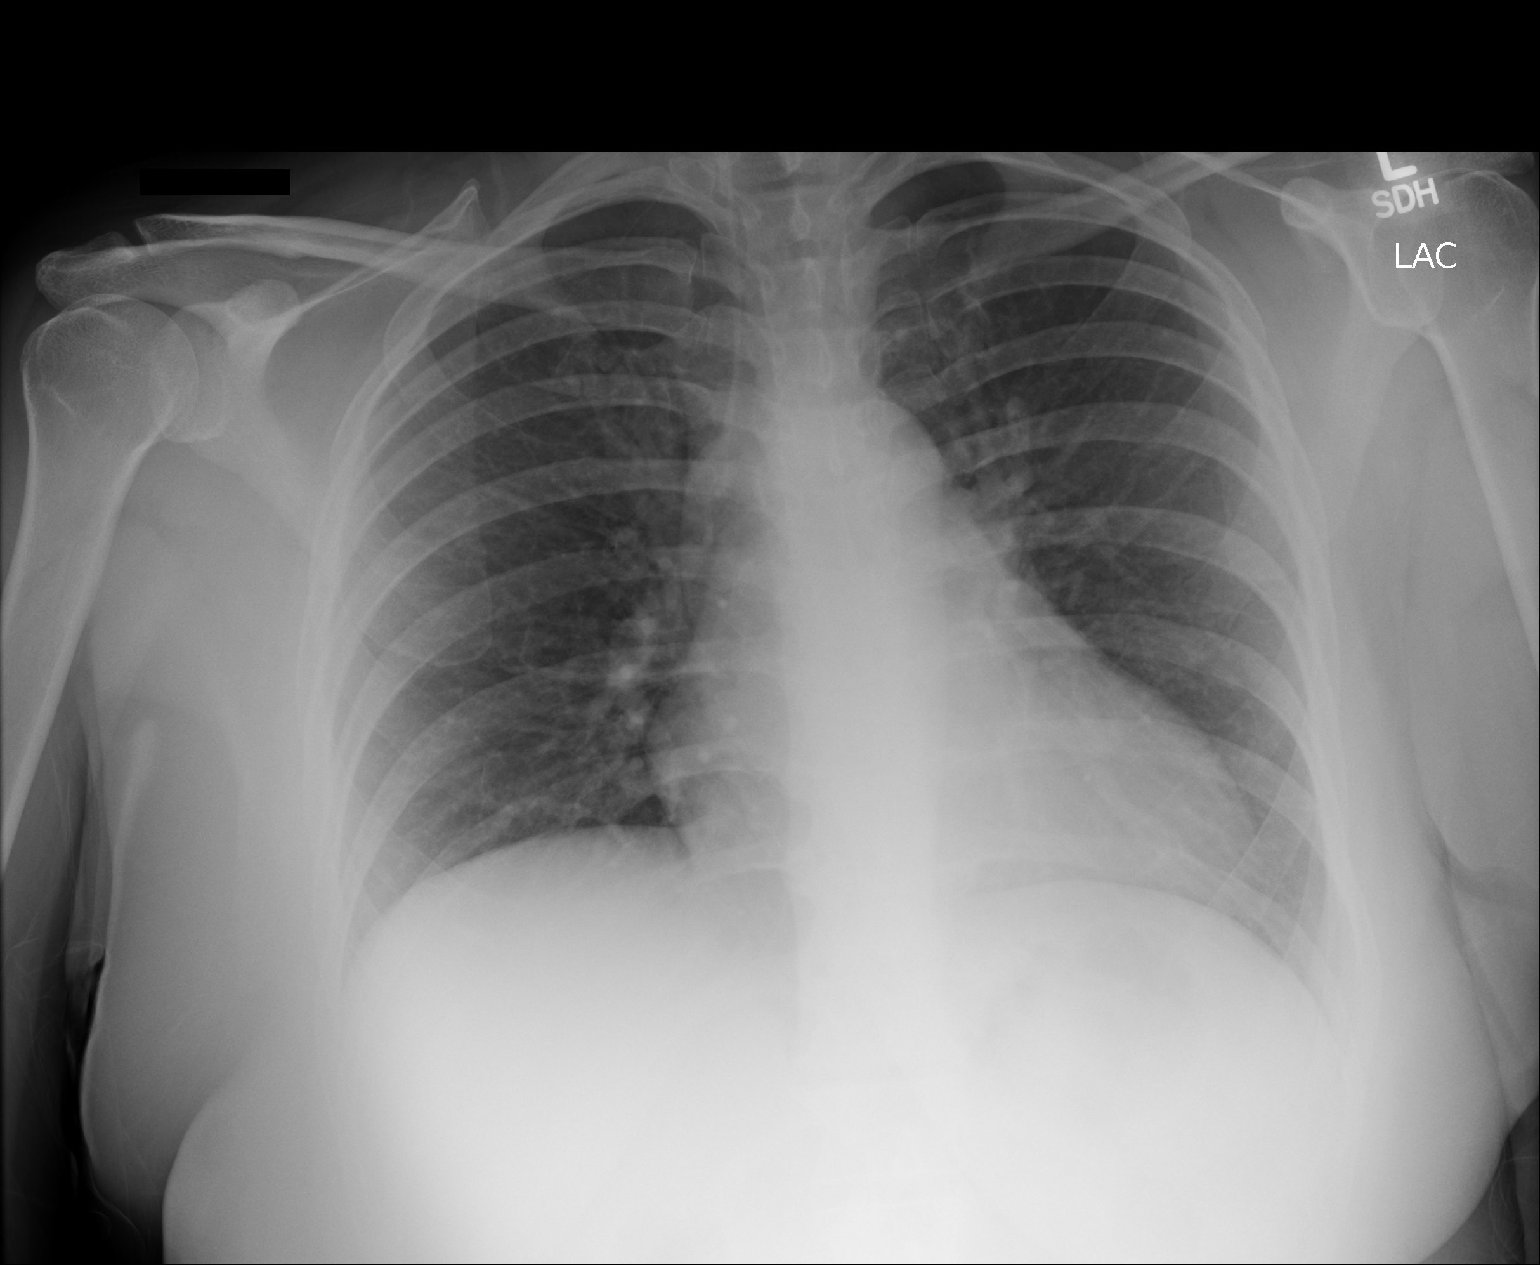

[1 of 1 positions shown; findings below may reference images not displayed]

FINDINGS: The heart size and mediastinal contours are within normal limits.
Both lungs are clear. The visualized skeletal structures are
unremarkable.
IMPRESSION: Normal exam.

## 2016-04-12 ENCOUNTER — Emergency Department: Payer: Self-pay

## 2016-04-12 ENCOUNTER — Encounter: Payer: Self-pay | Admitting: *Deleted

## 2016-04-12 ENCOUNTER — Emergency Department
Admission: EM | Admit: 2016-04-12 | Discharge: 2016-04-12 | Disposition: A | Discharge status: Home / Self Care | Payer: Self-pay | Attending: Emergency Medicine | Admitting: Emergency Medicine

## 2016-04-12 DIAGNOSIS — H81391 Other peripheral vertigo, right ear: Secondary | ICD-10-CM | POA: Insufficient documentation

## 2016-04-12 LAB — BASIC METABOLIC PANEL
Anion gap: 5 (ref 5–15)
BUN: 12 mg/dL (ref 6–20)
CO2: 25 mmol/L (ref 22–32)
Calcium: 8.9 mg/dL (ref 8.9–10.3)
Chloride: 107 mmol/L (ref 101–111)
Creatinine, Ser: 0.66 mg/dL (ref 0.44–1.00)
GFR calc Af Amer: >60 mL/min (ref >60)
GFR calc non Af Amer: >60 mL/min (ref >60)
Glucose, Bld: 109 mg/dL — ABNORMAL HIGH (ref 65–99)
Potassium: 4.3 mmol/L (ref 3.5–5.1)
Sodium: 137 mmol/L (ref 135–145)

## 2016-04-12 LAB — CBC
HCT: 41.3 % (ref 35.0–47.0)
Hemoglobin: 14.1 g/dL (ref 12.0–16.0)
MCH: 30.1 pg (ref 26.0–34.0)
MCHC: 34.2 g/dL (ref 32.0–36.0)
MCV: 88 fL (ref 80.0–100.0)
Platelets: 249 10*3/uL (ref 150–440)
RBC: 4.69 MIL/uL (ref 3.80–5.20)
RDW: 13.2 % (ref 11.5–14.5)
WBC: 6.9 10*3/uL (ref 3.6–11.0)

## 2016-04-12 LAB — TROPONIN I: Troponin I: <0.03 ng/mL (ref <0.03)

## 2016-04-12 MED ORDER — MECLIZINE HCL 25 MG PO TABS
25.0000 mg | ORAL_TABLET | Freq: Three times a day (TID) | ORAL | 1 refills | Status: DC | PRN
Start: 2016-04-12 — End: 2017-03-22

## 2016-04-12 NOTE — ED Triage Notes (Signed)
States dizziness for several weeks with some chest pain yesterday, states hx of diagnosed bradycardia, states she "ate a lot of salt yesterday since I used to take salt pills" , at present pt awake and alert in no acute distress
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
## 2017-03-22 ENCOUNTER — Encounter: Payer: Self-pay | Admitting: Podiatry

## 2017-03-22 ENCOUNTER — Ambulatory Visit (INDEPENDENT_AMBULATORY_CARE_PROVIDER_SITE_OTHER): Payer: BLUE CROSS/BLUE SHIELD

## 2017-03-22 ENCOUNTER — Ambulatory Visit (INDEPENDENT_AMBULATORY_CARE_PROVIDER_SITE_OTHER): Payer: BLUE CROSS/BLUE SHIELD | Admitting: Podiatry

## 2017-03-22 DIAGNOSIS — L719 Rosacea, unspecified: Secondary | ICD-10-CM | POA: Insufficient documentation

## 2017-03-22 DIAGNOSIS — L603 Nail dystrophy: Secondary | ICD-10-CM | POA: Diagnosis not present

## 2017-03-22 DIAGNOSIS — M79674 Pain in right toe(s): Secondary | ICD-10-CM

## 2017-03-22 DIAGNOSIS — L600 Ingrowing nail: Secondary | ICD-10-CM | POA: Diagnosis not present

## 2017-03-22 DIAGNOSIS — S90111A Contusion of right great toe without damage to nail, initial encounter: Secondary | ICD-10-CM

## 2017-03-22 MED ORDER — HYDROCODONE-ACETAMINOPHEN 10-325 MG PO TABS: 1.0000 | ORAL_TABLET | Freq: Four times a day (QID) | ORAL | 0 refills | Status: DC | PRN

## 2017-03-22 NOTE — Progress Notes (Signed)
She presents today with a chief complaint of pain to the right hallux times 5-6 months.  She states that her toenails started off turning white and she hit her toenail and half of it came off.  She reports that the toe became red and swollen and the redness which is now subsided but still has a lot of pain with it.  She states that the tip of the toe is very very sore she is concerned that her toenail is starting to dig into the toe.  Objective: Vital signs are stable alert and oriented x3.  Pulses are palpable.  Neurologic sensorium is intact.  Degenerative flexors are intact.  Muscle strength was 5/5 flexor plantar flexors inverters everters all intrinsic musculature is intact.  Orthopedic evaluation demonstrates pain on palpation to the distal aspect of the toe.  She has mild tenderness on range of motion of the first metatarsophalangeal joint of the right foot.  Mild tenderness on range of motion of the hallux interphalangeal joint.  Cutaneous evaluation demonstrates a portion of the toenail that appears to be digging into the distal aspect of the toe with mild erythema distally and pain exquisite in nature at that site.  Radiographic evaluation demonstrates no acute trauma but it does demonstrate what appears to be a chronic trauma to the distal aspect of the toe with a tuft of the toe was possibly injured previously or is a chronic injury that is resulting in a hypertrophy of the tuft of the toe.  There are small areas that demonstrate what appears to be fractures within the tuft of the toe but she denies any recent trauma.  Assessment: Painful hallux right foot.  Ingrown nail hallux right.  Plan: Nail avulsion hallux right.  This is performed after 3 cc of a 50-50 Mr. Marcaine plain lidocaine plain were infiltrated in a hallux block.  She tolerated procedure well without complications.  The nail was removed and sent for pathologic evaluation.  She was given both oral and written home-going instructions  as well as a prescription for narcotic and I will follow-up with her in 1-2 weeks.

## 2017-03-22 NOTE — Patient Instructions (Signed)

## 2017-04-05 ENCOUNTER — Encounter: Payer: Self-pay | Admitting: Podiatry

## 2017-04-05 ENCOUNTER — Ambulatory Visit (INDEPENDENT_AMBULATORY_CARE_PROVIDER_SITE_OTHER): Payer: BLUE CROSS/BLUE SHIELD | Admitting: Podiatry

## 2017-04-05 DIAGNOSIS — L600 Ingrowing nail: Secondary | ICD-10-CM

## 2017-04-05 NOTE — Progress Notes (Signed)
She presents today for follow-up of her nail avulsion hallux right.  She states that is doing pretty good though it does tend to rub in her shoes a bit.  Objective: Vital signs are stable she is alert and oriented x3 there is no erythema edema cellulitis drainage or odor granulation and epithelialization is occurring.  Assessment: Well-healing nail avulsion hallux right.  Plan: Continue to soak Epsom salts and warm water daily covered in the daily open at bedtime.  Follow-up with me in 2 weeks for her pathology results.

## 2017-04-26 ENCOUNTER — Ambulatory Visit (INDEPENDENT_AMBULATORY_CARE_PROVIDER_SITE_OTHER): Payer: BLUE CROSS/BLUE SHIELD | Admitting: Podiatry

## 2017-04-26 ENCOUNTER — Encounter: Payer: Self-pay | Admitting: Podiatry

## 2017-04-26 DIAGNOSIS — L603 Nail dystrophy: Secondary | ICD-10-CM

## 2017-04-26 MED ORDER — TERBINAFINE HCL 250 MG PO TABS: 250.0000 mg | ORAL_TABLET | Freq: Every day | ORAL | 0 refills | Status: DC

## 2017-04-26 MED ORDER — MUPIROCIN 2 % EX OINT: TOPICAL_OINTMENT | CUTANEOUS | 1 refills | Status: DC

## 2017-04-26 NOTE — Progress Notes (Signed)
She presents today status post nail avulsion hallux right.  States that is still red and tender around the top edge but possibly from my shoes but is scaly also saw 1 of my can use lotion on it.  Objective: Vital signs are stable she is alert and oriented x3 hallux right demonstrates a well healed avulsion.  There is some mild erythema surrounding the avulsion site but this very well could be a mild fungus on the skin or could just be due to bacterial infection or even irritation.  Assessment: Cannot rule out a tinea infection or bacterial infection more than likely just irritation.  Plan: At this point I would highly recommend 30 days of Lamisil therapy 1 tablet every day as well as a topical antibiotic Bactroban.  Follow-up with her in 1 month to 6 weeks.

## 2017-05-22 ENCOUNTER — Encounter: Payer: Self-pay | Admitting: Obstetrics and Gynecology

## 2017-05-22 ENCOUNTER — Ambulatory Visit (INDEPENDENT_AMBULATORY_CARE_PROVIDER_SITE_OTHER): Payer: BLUE CROSS/BLUE SHIELD | Admitting: Obstetrics and Gynecology

## 2017-05-22 VITALS — BP 112/72 | HR 88 | Ht 67.0 in | Wt 195.0 lb

## 2017-05-22 DIAGNOSIS — Z1239 Encounter for other screening for malignant neoplasm of breast: Secondary | ICD-10-CM

## 2017-05-22 DIAGNOSIS — Z1329 Encounter for screening for other suspected endocrine disorder: Secondary | ICD-10-CM | POA: Diagnosis not present

## 2017-05-22 DIAGNOSIS — Z124 Encounter for screening for malignant neoplasm of cervix: Secondary | ICD-10-CM

## 2017-05-22 DIAGNOSIS — T65831A Toxic effect of fiberglass, accidental (unintentional), initial encounter: Secondary | ICD-10-CM | POA: Insufficient documentation

## 2017-05-22 DIAGNOSIS — Z1231 Encounter for screening mammogram for malignant neoplasm of breast: Secondary | ICD-10-CM | POA: Diagnosis not present

## 2017-05-22 DIAGNOSIS — B009 Herpesviral infection, unspecified: Secondary | ICD-10-CM | POA: Diagnosis not present

## 2017-05-22 MED ORDER — VALACYCLOVIR HCL 500 MG PO TABS: 500.0000 mg | ORAL_TABLET | Freq: Every day | ORAL | 11 refills | Status: DC

## 2017-05-22 NOTE — Progress Notes (Signed)
Gynecology Annual Exam  PCP: Patient, No Pcp Per  Chief Complaint:  Chief Complaint  Patient presents with  . Gynecologic Exam    History of Present Illness: Patient is a 42 y.o. [id number] presents for annual exam. The patient has no complaints today.   LMP: Patient's last menstrual period was 05/08/2017 (approximate). Average Interval: regular, 28 days Duration of flow: 3-5 days Heavy Menses: occassionally Clots: no Intermenstrual Bleeding: no Postcoital Bleeding: no Dysmenorrhea: no   The patient is sexually active. She currently uses none for contraception. She denies dyspareunia.  The patient does not perform self breast exams.  There is notable family history of breast or ovarian cancer in her family. Her grandmother had breast cancer. She is currently 44 and the patient says she was in her teen's which would be approximately 30 years ago making her grandmother 75 at the time, but this is uncertain. Asked patient to check with her family and let us know if she was younger than 42 yo.   The patient wears seatbelts: yes.   The patient has regular exercise: yes.    Patient's current job exposes her to fiberglass.   Review of Systems: ROS  Past Medical History:  Past Medical History:  Diagnosis Date  . History of bradycardia   . HSV-2 (herpes simplex virus 2) infection     Past Surgical History:  Past Surgical History:  Procedure Laterality Date  . KNEE SURGERY      Gynecologic History:  Patient's last menstrual period was 05/08/2017 (approximate). Contraception: none Last Pap: Results were: more than 5 years ago, unknown Last mammogram: Never  Obstetric History: [id number]  Family History:  Family History  Problem Relation Age of Onset  . Heart disease Mother   . Multiple myeloma Maternal Aunt   . Breast cancer Maternal Grandmother   . Lung cancer Paternal Grandmother   . Stomach cancer Paternal Grandmother     Social History:  Social History    Socioeconomic History  . Marital status: Single    Spouse name: Not on file  . Number of children: 2  . Years of education: Not on file  . Highest education level: Not on file  Occupational History  . Not on file  Social Needs  . Financial resource strain: Not on file  . Food insecurity:    Worry: Not on file    Inability: Not on file  . Transportation needs:    Medical: Not on file    Non-medical: Not on file  Tobacco Use  . Smoking status: Never Smoker  . Smokeless tobacco: Never Used  Substance and Sexual Activity  . Alcohol use: Yes    Alcohol/week: 0.5 oz    Types: 1 Standard drinks or equivalent per week  . Drug use: No  . Sexual activity: Not Currently    Partners: Female    Birth control/protection: None  Lifestyle  . Physical activity:    Days per week: 6 days    Minutes per session: 120 min  . Stress: Only a little  Relationships  . Social connections:    Talks on phone: Not on file    Gets together: Not on file    Attends religious service: Not on file    Active member of club or organization: Not on file    Attends meetings of clubs or organizations: Not on file    Relationship status: Divorced  . Intimate partner violence:    Fear of current or ex partner:  Not on file    Emotionally abused: Not on file    Physically abused: Not on file    Forced sexual activity: Not on file  Other Topics Concern  . Not on file  Social History Narrative  . Not on file    Allergies:  Allergies  Allergen Reactions  . Tramadol Other (See Comments)  . Ultram [Tramadol Hcl]     Medications: Prior to Admission medications   Medication Sig Start Date End Date Taking? Authorizing Provider  fluticasone (FLONASE) 50 MCG/ACT nasal spray  04/25/17  Yes [provider]  mupirocin ointment (BACTROBAN) 2 % Apply to wound after soaking BID 04/26/17  Yes Hyatt, Max T, DPM  terbinafine (LAMISIL) 250 MG tablet Take 1 tablet (250 mg total) by mouth daily. 04/26/17  Yes  Hyatt, Max T, DPM  valACYclovir (VALTREX) 500 MG tablet Take 500 mg by mouth daily. 12/14/16  Yes [provider]    Physical Exam Vitals: Blood pressure 112/72, pulse 88, height '5\' 7"'  (1.702 m), weight 195 lb (88.5 kg), last menstrual period 05/08/2017.  General: NAD HEENT: normocephalic, anicteric Thyroid: no enlargement, no palpable nodules Pulmonary: No increased work of breathing, CTAB Cardiovascular: RRR, distal pulses 2+ Breast: Breast symmetrical, no tenderness, no palpable nodules or masses, no skin or nipple retraction present, no nipple discharge.  No axillary or supraclavicular lymphadenopathy. Abdomen: NABS, soft, non-tender, non-distended.  Umbilicus without lesions.  No hepatomegaly, splenomegaly or masses palpable. No evidence of hernia  Genitourinary:  External: Normal external female genitalia.  Normal urethral meatus, normal Bartholin's and Skene's glands.    Vagina: Normal vaginal mucosa, no evidence of prolapse.    Cervix: Grossly normal in appearance, no bleeding  Uterus: Non-enlarged, mobile, normal contour.  No CMT. Prolapse of cervix to -1.  Patient reports sensation of buldge with lifting heavy objects. Recommended kegel exercise.   Adnexa: ovaries non-enlarged, no adnexal masses  Rectal: deferred  Lymphatic: no evidence of inguinal lymphadenopathy Extremities: no edema, erythema, or tenderness Neurologic: Grossly intact Psychiatric: mood appropriate, affect full  Female chaperone present for pelvic and breast  portions of the physical exam    Assessment: 42 y.o. [id number] routine annual exam  Plan: Problem List Items Addressed This Visit      Other   Toxic effect of fiberglass    Other Visit Diagnoses    Herpes simplex virus infection    -  Primary   Relevant Medications   valACYclovir (VALTREX) 500 MG tablet   Encounter for screening breast examination       Relevant Orders   MM DIGITAL SCREENING BILATERAL   Screening for cervical cancer        Relevant Orders   PapIG, CtNgTv, HPV, rfx 16/18   Thyroid disorder screening       Relevant Orders   TSH      1) Mammogram - recommend yearly screening mammogram.  Mammogram Was ordered today   2) STI screening  wasoffered and declined  3) ASCCP guidelines and rational discussed.  Patient opts for every 3 years screening interval  4) Contraception - the patient is currently using  none.  She is happy with her current form of contraception and plans to continue  5) Colonoscopy -- Screening recommended starting at age 49 for average risk individuals, age 35 for individuals deemed at increased risk (including African Americans) and recommended to continue until age 3.  For patient age 83-85 individualized approach is recommended.  Gold standard screening is via colonoscopy, Cologuard  screening is an acceptable alternative for patient unwilling or unable to undergo colonoscopy.  "Colorectal cancer screening for average?risk adults: 2018 guideline update from the American Cancer Society"CA: A Cancer Journal for Clinicians: Jul 13, 2016   6) Routine healthcare maintenance including cholesterol, diabetes screening discussed Declines. She states she had labs ordered through her work recently.   7) Patient would like to be on suppressive valtrex therapy, prescription sent for daily dosing.   8) Return in about 1 year (around 05/23/2018) for annual.  Adrian Prows MD [facility] 05/22/2017, 7:32 PM

## 2017-05-23 LAB — TSH: TSH: 1.85 u[IU]/mL (ref 0.450–4.500)

## 2017-05-23 NOTE — Progress Notes (Signed)
Please call Daisy Ross and let her know her thyroid function is normal.  Thank you, Dr. Gilman Schmidt

## 2017-05-25 LAB — PAPIG, CTNGTV, HPV, RFX 16/18
Chlamydia, Nuc. Acid Amp: NEGATIVE
Gonococcus, Nuc. Acid Amp: NEGATIVE
HPV, high-risk: NEGATIVE
PAP Smear Comment: 0
Trich vag by NAA: NEGATIVE

## 2017-05-28 NOTE — Progress Notes (Signed)
Please call Daisy Ross and let her know that her pap smear was normal. Please also remind her to have her mammogram performed. Thank you, Dr. Gilman Schmidt

## 2017-06-12 ENCOUNTER — Ambulatory Visit (INDEPENDENT_AMBULATORY_CARE_PROVIDER_SITE_OTHER): Payer: BLUE CROSS/BLUE SHIELD | Admitting: Podiatry

## 2017-06-12 ENCOUNTER — Encounter: Payer: Self-pay | Admitting: Podiatry

## 2017-06-12 DIAGNOSIS — L603 Nail dystrophy: Secondary | ICD-10-CM | POA: Diagnosis not present

## 2017-06-12 MED ORDER — TERBINAFINE HCL 250 MG PO TABS: 250.0000 mg | ORAL_TABLET | Freq: Every day | ORAL | 0 refills | Status: DC

## 2017-06-12 NOTE — Progress Notes (Signed)
She presents today for follow-up of her right hallux.  States that she completed 30 days of Lamisil and her toes doing better the redness is going down.  She denies any problems taking Lamisil.  Objective: Vital signs are stable she is alert and oriented x3 is mild erythema surrounding the nailbed.  Epionychium still appears to be detached.  Assessment: Mild paronychia resolving paronychia with cellulitis.  Plan: I will dispense 1 more 30-day dose of Lamisil I will follow-up with her in 2 months.  Remember to ask her how her new job is going.

## 2017-08-09 ENCOUNTER — Ambulatory Visit: Payer: BLUE CROSS/BLUE SHIELD | Admitting: Podiatry

## 2017-08-14 ENCOUNTER — Emergency Department
Admission: EM | Admit: 2017-08-14 | Discharge: 2017-08-14 | Disposition: A | Discharge status: Home / Self Care | Payer: BLUE CROSS/BLUE SHIELD | Attending: Emergency Medicine | Admitting: Emergency Medicine

## 2017-08-14 ENCOUNTER — Other Ambulatory Visit: Payer: Self-pay

## 2017-08-14 DIAGNOSIS — W269XXA Contact with unspecified sharp object(s), initial encounter: Secondary | ICD-10-CM | POA: Insufficient documentation

## 2017-08-14 DIAGNOSIS — Y999 Unspecified external cause status: Secondary | ICD-10-CM | POA: Insufficient documentation

## 2017-08-14 DIAGNOSIS — S0501XA Injury of conjunctiva and corneal abrasion without foreign body, right eye, initial encounter: Secondary | ICD-10-CM | POA: Insufficient documentation

## 2017-08-14 DIAGNOSIS — Y929 Unspecified place or not applicable: Secondary | ICD-10-CM | POA: Insufficient documentation

## 2017-08-14 DIAGNOSIS — Y939 Activity, unspecified: Secondary | ICD-10-CM | POA: Insufficient documentation

## 2017-08-14 MED ORDER — IBUPROFEN 600 MG PO TABS: 600.0000 mg | ORAL_TABLET | Freq: Three times a day (TID) | ORAL | 0 refills | Status: DC | PRN

## 2017-08-14 MED ORDER — HYDROCODONE-ACETAMINOPHEN 5-325 MG PO TABS
1.0000 | ORAL_TABLET | Freq: Once | ORAL | Status: AC
  Administered 2017-08-14: 1 via ORAL
  Filled 2017-08-14: qty 1

## 2017-08-14 MED ORDER — HYDROCODONE-ACETAMINOPHEN 5-325 MG PO TABS: 1.0000 | ORAL_TABLET | Freq: Four times a day (QID) | ORAL | 0 refills | Status: DC | PRN

## 2017-08-14 MED ORDER — TETRACAINE HCL 0.5 % OP SOLN
2.0000 [drp] | Freq: Once | OPHTHALMIC | Status: AC
  Administered 2017-08-14: 2 [drp] via OPHTHALMIC
  Filled 2017-08-14: qty 4

## 2017-08-14 MED ORDER — CIPROFLOXACIN HCL 0.3 % OP SOLN
1.0000 [drp] | Freq: Once | OPHTHALMIC | Status: AC
Start: 2017-08-14 — End: 2017-08-14
  Administered 2017-08-14: 1 [drp] via OPHTHALMIC
  Filled 2017-08-14: qty 2.5

## 2017-08-14 MED ORDER — IBUPROFEN 600 MG PO TABS
600.0000 mg | ORAL_TABLET | Freq: Once | ORAL | Status: AC
  Administered 2017-08-14: 600 mg via ORAL
  Filled 2017-08-14: qty 1

## 2017-08-14 MED ORDER — FLUORESCEIN SODIUM 1 MG OP STRP
1.0000 | ORAL_STRIP | Freq: Once | OPHTHALMIC | Status: AC
  Administered 2017-08-14: 1 via OPHTHALMIC
  Filled 2017-08-14: qty 1
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
## 2017-08-14 NOTE — ED Notes (Signed)
Visual acuity - Left eye 20/20 Patient reports unable to open right eye to see chart.
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
## 2018-07-19 ENCOUNTER — Other Ambulatory Visit: Payer: Self-pay

## 2018-07-19 ENCOUNTER — Telehealth: Payer: Self-pay | Admitting: *Deleted

## 2018-07-19 DIAGNOSIS — Z20822 Contact with and (suspected) exposure to covid-19: Secondary | ICD-10-CM

## 2018-07-19 NOTE — Telephone Encounter (Signed)
Testing for Covid-19 requested per [facility] Dept. States pt is at site. Order placed. Pt with recent exposure, high risk environment.  CB# [phone]

## 2018-07-20 ENCOUNTER — Other Ambulatory Visit: Payer: Self-pay | Admitting: Obstetrics and Gynecology

## 2018-07-20 DIAGNOSIS — B009 Herpesviral infection, unspecified: Secondary | ICD-10-CM

## 2018-07-21 LAB — NOVEL CORONAVIRUS, NAA: SARS-CoV-2, NAA: NOT DETECTED

## 2018-10-30 ENCOUNTER — Inpatient Hospital Stay
Admission: EM | Admit: 2018-10-30 | Discharge: 2018-11-02 | DRG: 603 | Disposition: A | Discharge status: Home / Self Care | Payer: BC Managed Care – PPO | Attending: Specialist | Admitting: Specialist

## 2018-10-30 ENCOUNTER — Emergency Department: Payer: BC Managed Care – PPO

## 2018-10-30 ENCOUNTER — Other Ambulatory Visit: Payer: Self-pay

## 2018-10-30 ENCOUNTER — Encounter: Payer: Self-pay | Admitting: Emergency Medicine

## 2018-10-30 DIAGNOSIS — R030 Elevated blood-pressure reading, without diagnosis of hypertension: Secondary | ICD-10-CM | POA: Diagnosis present

## 2018-10-30 DIAGNOSIS — W57XXXA Bitten or stung by nonvenomous insect and other nonvenomous arthropods, initial encounter: Secondary | ICD-10-CM

## 2018-10-30 DIAGNOSIS — Z888 Allergy status to other drugs, medicaments and biological substances status: Secondary | ICD-10-CM | POA: Diagnosis not present

## 2018-10-30 DIAGNOSIS — S80862A Insect bite (nonvenomous), left lower leg, initial encounter: Secondary | ICD-10-CM

## 2018-10-30 DIAGNOSIS — Z8249 Family history of ischemic heart disease and other diseases of the circulatory system: Secondary | ICD-10-CM

## 2018-10-30 DIAGNOSIS — E872 Acidosis: Secondary | ICD-10-CM | POA: Diagnosis present

## 2018-10-30 DIAGNOSIS — L03116 Cellulitis of left lower limb: Principal | ICD-10-CM

## 2018-10-30 DIAGNOSIS — E1165 Type 2 diabetes mellitus with hyperglycemia: Secondary | ICD-10-CM | POA: Diagnosis present

## 2018-10-30 DIAGNOSIS — T63461A Toxic effect of venom of wasps, accidental (unintentional), initial encounter: Secondary | ICD-10-CM | POA: Diagnosis present

## 2018-10-30 DIAGNOSIS — Z79899 Other long term (current) drug therapy: Secondary | ICD-10-CM

## 2018-10-30 DIAGNOSIS — A6000 Herpesviral infection of urogenital system, unspecified: Secondary | ICD-10-CM | POA: Diagnosis present

## 2018-10-30 DIAGNOSIS — R7989 Other specified abnormal findings of blood chemistry: Secondary | ICD-10-CM

## 2018-10-30 DIAGNOSIS — T63301A Toxic effect of unspecified spider venom, accidental (unintentional), initial encounter: Secondary | ICD-10-CM | POA: Diagnosis present

## 2018-10-30 DIAGNOSIS — Z20828 Contact with and (suspected) exposure to other viral communicable diseases: Secondary | ICD-10-CM | POA: Diagnosis present

## 2018-10-30 DIAGNOSIS — Z833 Family history of diabetes mellitus: Secondary | ICD-10-CM

## 2018-10-30 DIAGNOSIS — M7989 Other specified soft tissue disorders: Secondary | ICD-10-CM | POA: Diagnosis not present

## 2018-10-30 LAB — CBC WITH DIFFERENTIAL/PLATELET
Abs Immature Granulocytes: 0.03 10*3/uL (ref 0.00–0.07)
Basophils Absolute: 0 10*3/uL (ref 0.0–0.1)
Basophils Relative: 0 %
Eosinophils Absolute: 0 10*3/uL (ref 0.0–0.5)
Eosinophils Relative: 0 %
HCT: 41.8 % (ref 36.0–46.0)
Hemoglobin: 14 g/dL (ref 12.0–15.0)
Immature Granulocytes: 0 %
Lymphocytes Relative: 6 %
Lymphs Abs: 0.5 10*3/uL — ABNORMAL LOW (ref 0.7–4.0)
MCH: 29.4 pg (ref 26.0–34.0)
MCHC: 33.5 g/dL (ref 30.0–36.0)
MCV: 87.8 fL (ref 80.0–100.0)
Monocytes Absolute: 0.1 10*3/uL (ref 0.1–1.0)
Monocytes Relative: 1 %
Neutro Abs: 8.4 10*3/uL — ABNORMAL HIGH (ref 1.7–7.7)
Neutrophils Relative %: 93 %
Platelets: 274 10*3/uL (ref 150–400)
RBC: 4.76 MIL/uL (ref 3.87–5.11)
RDW: 12.5 % (ref 11.5–15.5)
WBC: 9 10*3/uL (ref 4.0–10.5)
nRBC: 0 % (ref 0.0–0.2)

## 2018-10-30 LAB — COMPREHENSIVE METABOLIC PANEL
ALT: 44 U/L (ref 0–44)
AST: 33 U/L (ref 15–41)
Albumin: 3.9 g/dL (ref 3.5–5.0)
Alkaline Phosphatase: 68 U/L (ref 38–126)
Anion gap: 10 (ref 5–15)
BUN: 10 mg/dL (ref 6–20)
CO2: 20 mmol/L — ABNORMAL LOW (ref 22–32)
Calcium: 9.7 mg/dL (ref 8.9–10.3)
Chloride: 102 mmol/L (ref 98–111)
Creatinine, Ser: 0.71 mg/dL (ref 0.44–1.00)
GFR calc Af Amer: >60 mL/min (ref >60)
GFR calc non Af Amer: >60 mL/min (ref >60)
Glucose, Bld: 338 mg/dL — ABNORMAL HIGH (ref 70–99)
Potassium: 3.8 mmol/L (ref 3.5–5.1)
Sodium: 132 mmol/L — ABNORMAL LOW (ref 135–145)
Total Bilirubin: 0.6 mg/dL (ref 0.3–1.2)
Total Protein: 8.2 g/dL — ABNORMAL HIGH (ref 6.5–8.1)

## 2018-10-30 LAB — LACTIC ACID, PLASMA: Lactic Acid, Venous: 2.9 mmol/L (ref 0.5–1.9)

## 2018-10-30 MED ORDER — VANCOMYCIN HCL IN DEXTROSE 1-5 GM/200ML-% IV SOLN
1000.0000 mg | Freq: Once | INTRAVENOUS | Status: AC
Start: 2018-10-30 — End: 2018-10-30
  Administered 2018-10-30: 1000 mg via INTRAVENOUS
  Filled 2018-10-30: qty 200

## 2018-10-30 MED ORDER — CLINDAMYCIN PHOSPHATE 600 MG/50ML IV SOLN
600.0000 mg | Freq: Three times a day (TID) | INTRAVENOUS | Status: DC
  Filled 2018-10-30 (×3): qty 50

## 2018-10-30 MED ORDER — ENOXAPARIN SODIUM 40 MG/0.4ML ~~LOC~~ SOLN
40.0000 mg | Freq: Every day | SUBCUTANEOUS | Status: DC
  Administered 2018-10-31 – 2018-11-01 (×3): 40 mg via SUBCUTANEOUS
  Filled 2018-10-30 (×3): qty 0.4

## 2018-10-30 MED ORDER — SODIUM CHLORIDE 0.9 % IV SOLN
INTRAVENOUS | Status: DC
  Administered 2018-10-31 (×2): via INTRAVENOUS

## 2018-10-30 MED ORDER — VANCOMYCIN HCL IN DEXTROSE 1-5 GM/200ML-% IV SOLN: 1000.0000 mg | Freq: Once | INTRAVENOUS | Status: DC

## 2018-10-30 MED ORDER — PIPERACILLIN-TAZOBACTAM 3.375 G IVPB 30 MIN
3.3750 g | Freq: Once | INTRAVENOUS | Status: AC
  Administered 2018-10-31: 3.375 g via INTRAVENOUS
  Filled 2018-10-30: qty 50

## 2018-10-30 MED ORDER — FLUTICASONE PROPIONATE 50 MCG/ACT NA SUSP
1.0000 | Freq: Every day | NASAL | Status: DC | PRN
  Filled 2018-10-30: qty 16

## 2018-10-30 MED ORDER — VALACYCLOVIR HCL 500 MG PO TABS
500.0000 mg | ORAL_TABLET | Freq: Every day | ORAL | Status: DC
  Administered 2018-10-31 – 2018-11-01 (×2): 500 mg via ORAL
  Filled 2018-10-30 (×3): qty 1

## 2018-10-30 NOTE — ED Notes (Signed)
Date and time results received: 10/30/18  2115 Test: Lactic acid Critical Value: 2.9  Name of Provider Notified: Dr. Cinda Quest  Orders Received? Or Actions Taken?: Acknowledged.

## 2018-10-30 NOTE — ED Notes (Signed)
Patient refused pregnancy test. " Reports she is gay"

## 2018-10-30 NOTE — ED Triage Notes (Addendum)
Pt reports wasp sting 3 days ago to left lower leg; currently taking clindamycin as rx by urgent care after taking augmentin for 1 day; st symptoms increased after 1 day so antibiotic was changed & received steroid inj as well; c/o persistent redness/swelling to lower leg

## 2018-10-30 NOTE — H&P (Addendum)
[facility] [place] NAME: Daisy Ross    MR#:  [id number]  DATE OF BIRTH:  [birth year]  DATE OF ADMISSION:  10/30/2018  PRIMARY CARE PHYSICIAN: Patient, No Pcp Per   REQUESTING/REFERRING PHYSICIAN: Conni Slipper, MD  CHIEF COMPLAINT:   Chief Complaint  Patient presents with  . Leg Swelling    HISTORY OF PRESENT ILLNESS:  43 year old female with a past medical history of bradycardia, prediabetes, and herpes simplex virus 2 infection presenting to the ED with complaints of left leg swelling, redness and pain.  She reports she was stung by a wasp on her left ankle last Sunday and then in the afternoon by another insect up her left shin.  Since the episode she has developed redness and swelling in her left leg.  Last night she had an episode of shaking chills and sweating without fever, nausea or vomiting.  Denies drainage from the site however she is noticed tenderness and red spots or streaks surrounding the bite with dimpling.  She went to urgent care and was started on clindamycin which she took for 2 days however she reports not getting better therefore was also given steroid injection with slight improvement.  Today she noticed worsening symptoms despite treatment therefore decided to come to the ED for further evaluation.  On arrival to the ED, she was afebrile with blood pressure 162/100 mm Hg and pulse rate 81 beats/min.  Initial labs revealed normal white count, sodium 132, glucose 338, and lactic acid of 2.9.  Had left lower venous Doppler ultrasound which did not show evidence of DVT.  Patient was started on empiric antibiotics following blood cultures.  She will be admitted under hospitalist service for further management.  PAST MEDICAL HISTORY:   Past Medical History:  Diagnosis Date  . History of bradycardia   . HSV-2 (herpes simplex virus 2) infection     PAST SURGICAL HISTORY:   Past Surgical History:  Procedure Laterality  Date  . KNEE SURGERY      SOCIAL HISTORY:   Social History   Tobacco Use  . Smoking status: Never Smoker  . Smokeless tobacco: Never Used  Substance Use Topics  . Alcohol use: Yes    Alcohol/week: 1.0 standard drinks    Types: 1 Standard drinks or equivalent per week    FAMILY HISTORY:   Family History  Problem Relation Age of Onset  . Heart disease Mother   . Multiple myeloma Maternal Aunt   . Breast cancer Maternal Grandmother   . Lung cancer Paternal Grandmother   . Stomach cancer Paternal Grandmother     DRUG ALLERGIES:   Allergies  Allergen Reactions  . Tramadol Other (See Comments)  . Ultram [Tramadol Hcl]     REVIEW OF SYSTEMS:   Review of Systems  Constitutional: Positive for chills and fever. Negative for malaise/fatigue and weight loss.  HENT: Negative for congestion, hearing loss and sore throat.   Eyes: Negative for blurred vision and double vision.  Respiratory: Negative for cough, shortness of breath and wheezing.   Cardiovascular: Positive for leg swelling. Negative for chest pain, palpitations and orthopnea.  Gastrointestinal: Negative for abdominal pain, diarrhea, nausea and vomiting.  Genitourinary: Negative for dysuria and urgency.  Musculoskeletal: Negative for myalgias.  Skin: Positive for itching.       Left leg itching and pain  Neurological: Negative for dizziness, sensory change, speech change, focal weakness and headaches.  Psychiatric/Behavioral: Negative for depression.  MEDICATIONS AT HOME:   Prior to Admission medications   Medication Sig Start Date End Date Taking? Authorizing Provider  clindamycin (CLEOCIN) 300 MG capsule Take 1 capsule by mouth 2 (two) times daily. 10/30/18  Yes [provider]  fluticasone (FLONASE) 50 MCG/ACT nasal spray Place 1 spray into both nostrils daily as needed.  04/25/17  Yes [provider]  valACYclovir (VALTREX) 500 MG tablet TAKE 1 TABLET BY MOUTH EVERY DAY 07/20/18  Yes  Schuman, Christanna R, MD  amoxicillin-clavulanate (AUGMENTIN) 875-125 MG tablet Take 1 tablet by mouth every 12 (twelve) hours. 10/29/18   [provider]  HYDROcodone-acetaminophen (NORCO) 5-325 MG tablet Take 1 tablet by mouth every 6 (six) hours as needed for up to 7 doses for severe pain. Patient not taking: Reported on 10/30/2018 08/14/17   Darel Hong, MD  ibuprofen (ADVIL,MOTRIN) 600 MG tablet Take 1 tablet (600 mg total) by mouth every 8 (eight) hours as needed. Patient not taking: Reported on 10/30/2018 08/14/17   Darel Hong, MD  mupirocin ointment (BACTROBAN) 2 % Apply to wound after soaking BID Patient not taking: Reported on 10/30/2018 04/26/17   Hyatt, Max T, DPM  terbinafine (LAMISIL) 250 MG tablet Take 1 tablet (250 mg total) by mouth daily. Patient not taking: Reported on 10/30/2018 06/12/17   Hyatt, Max T, DPM      VITAL SIGNS:  Blood pressure (!) 158/90, pulse 86, temperature 98.3 F (36.8 C), temperature source Oral, resp. rate 18, last menstrual period 09/29/2018, SpO2 99 %.  PHYSICAL EXAMINATION:   Physical Exam  GENERAL:  43 y.o.-year-old patient lying in the bed with no acute distress.  EYES: Pupils equal, round, reactive to light and accommodation. No scleral icterus. Extraocular muscles intact.  HEENT: Head atraumatic, normocephalic. Oropharynx and nasopharynx clear.  NECK:  Supple, no jugular venous distention. No thyroid enlargement, no tenderness.  LUNGS: Normal breath sounds bilaterally, no wheezing, rales,rhonchi or crepitation. No use of accessory muscles of respiration.  CARDIOVASCULAR: S1, S2 normal. No murmurs, rubs, or gallops.  ABDOMEN: Soft, nontender, nondistended. Bowel sounds present. No organomegaly or mass.  EXTREMITIES: No pedal edema, cyanosis, or clubbing. No rash or lesions. + pedal pulses MUSCULOSKELETAL: Normal bulk, and power was 5+ grip and elbow, knee, and ankle flexion and extension bilaterally.  NEUROLOGIC:Alert and oriented x  3. CN 2-12 intact. Sensation to light touch and cold stimuli intact bilaterally. Finger to nose nl. Babinski is downgoing. DTR's (biceps, patellar, and achilles) 2+ and symmetric throughout. Gait not tested due to safety concern. PSYCHIATRIC: The patient is alert and oriented x 3.  SKIN: See below    DATA REVIEWED:  LABORATORY PANEL:   CBC Recent Labs  Lab 10/30/18 2007  WBC 9.0  HGB 14.0  HCT 41.8  PLT 274   ------------------------------------------------------------------------------------------------------------------  Chemistries  Recent Labs  Lab 10/30/18 2007  NA 132*  K 3.8  CL 102  CO2 20*  GLUCOSE 338*  BUN 10  CREATININE 0.71  CALCIUM 9.7  AST 33  ALT 44  ALKPHOS 68  BILITOT 0.6   ------------------------------------------------------------------------------------------------------------------  Cardiac Enzymes No results for input(s): TROPONINI in the last 168 hours. ------------------------------------------------------------------------------------------------------------------  RADIOLOGY:  US Venous Img Lower Unilateral Left  Result Date: 10/30/2018 CLINICAL DATA:  Swelling and edema for 1 day EXAM: Left LOWER EXTREMITY VENOUS DOPPLER ULTRASOUND TECHNIQUE: Gray-scale sonography with graded compression, as well as color Doppler and duplex ultrasound were performed to evaluate the lower extremity deep venous systems from the level of the common femoral  vein and including the common femoral, femoral, profunda femoral, popliteal and calf veins including the posterior tibial, peroneal and gastrocnemius veins when visible. The superficial great saphenous vein was also interrogated. Spectral Doppler was utilized to evaluate flow at rest and with distal augmentation maneuvers in the common femoral, femoral and popliteal veins. COMPARISON:  None. FINDINGS: Contralateral Common Femoral Vein: Respiratory phasicity is normal and symmetric with the symptomatic side.  No evidence of thrombus. Normal compressibility. Common Femoral Vein: No evidence of thrombus. Normal compressibility, respiratory phasicity and response to augmentation. Saphenofemoral Junction: No evidence of thrombus. Normal compressibility and flow on color Doppler imaging. Profunda Femoral Vein: No evidence of thrombus. Normal compressibility and flow on color Doppler imaging. Femoral Vein: No evidence of thrombus. Normal compressibility, respiratory phasicity and response to augmentation. Popliteal Vein: No evidence of thrombus. Normal compressibility, respiratory phasicity and response to augmentation. Calf Veins: Poorly visualized. Other Findings:  None. IMPRESSION: No evidence of deep venous thrombosis. Electronically Signed   By: Donavan Foil M.D.   On: 10/30/2018 21:03    EKG:  EKG: there are no previous tracings available for comparison.  IMPRESSION AND PLAN:   43 y.o. female with a past medical history of bradycardia, prediabetes, and herpes simplex virus 2 infection presenting to the ED with complaints of left leg swelling, redness and pain.  1. Left leg cellulitis -secondary to insect bite - Admit to MedSurg unit - Blood cultures pending - PRN pain management - Start empiric antibiotics with Vanco and Zosyn  2. Prediabetes -blood glucose elevated on admission - Check hemoglobin A1c - Start SSI  3. Elevated blood pressure on presentation - No history of hypertension - PRN antihypertensives - Continue to monitor  4. HSV infection - Continue acyclovir  5. DVT prophylaxis - Enoxaparin  SubQ   All the records are reviewed and case discussed with ED provider. Management plans discussed with the patient, family and they are in agreement.  CODE STATUS: FULL  TOTAL TIME TAKING CARE OF THIS PATIENT: 50 minutes.    on 10/30/2018 at 11:39 PM   Rufina Falco, DNP, FNP-BC [facility] Nurse Practitioner Between 7am to 6pm - Pager [phone]  After 6pm go to  [email or web address] - password [email or web address] [facility]  Office  [phone]  CC: Primary care physician; Patient, No Pcp Per

## 2018-10-30 NOTE — ED Notes (Signed)
md at bedside, second bld culture drawn and sent, 1lt ns bolus infusing.

## 2018-10-30 NOTE — ED Provider Notes (Signed)
[facility] Emergency Department Provider Note   ____________________________________________   First MD Initiated Contact with Patient 10/30/18 2115     (approximate)  I have reviewed the triage vital signs and the nursing notes.   HISTORY  Chief Complaint Leg Swelling    HPI Daisy Ross is a 43 y.o. female patient was bitten on Sunday by a spider on her ankle and then in the afternoon by a last prior up the shin.  She has had increasing amounts of redness pain and swelling in the left leg since then.  Last night she had an episode of shaking chills and sweating.    Patient went to see her doctor and got a prescription for antibiotics was getting worse so she went back today she got clindamycin she took 2 so for the whole days worth but the leg which had gotten better after a steroid shot is now getting worse again.     Past Medical History:  Diagnosis Date  . History of bradycardia   . HSV-2 (herpes simplex virus 2) infection     Patient Active Problem List   Diagnosis Date Noted  . Toxic effect of fiberglass 05/22/2017  . Rosacea 03/22/2017  . Elevated LFTs 08/27/2013  . Prediabetes 08/27/2013  . Preoperative cardiovascular examination 08/15/2012  . Atypical chest pain 10/23/2010  . Dizziness 10/23/2010  . Bradycardia 10/23/2010  . Fatigue 10/23/2010    Past Surgical History:  Procedure Laterality Date  . KNEE SURGERY      Prior to Admission medications   Medication Sig Start Date End Date Taking? Authorizing Provider  fluticasone (FLONASE) 50 MCG/ACT nasal spray  04/25/17   [provider]  HYDROcodone-acetaminophen (NORCO) 5-325 MG tablet Take 1 tablet by mouth every 6 (six) hours as needed for up to 7 doses for severe pain. 08/14/17   Darel Hong, MD  ibuprofen (ADVIL,MOTRIN) 600 MG tablet Take 1 tablet (600 mg total) by mouth every 8 (eight) hours as needed. 08/14/17   Darel Hong, MD  mupirocin ointment (BACTROBAN)  2 % Apply to wound after soaking BID 04/26/17   Hyatt, Max T, DPM  terbinafine (LAMISIL) 250 MG tablet Take 1 tablet (250 mg total) by mouth daily. 06/12/17   Hyatt, Max T, DPM  valACYclovir (VALTREX) 500 MG tablet TAKE 1 TABLET BY MOUTH EVERY DAY 07/20/18   Schuman, Stefanie Libel, MD    Allergies Tramadol and Ultram [tramadol hcl]  Family History  Problem Relation Age of Onset  . Heart disease Mother   . Multiple myeloma Maternal Aunt   . Breast cancer Maternal Grandmother   . Lung cancer Paternal Grandmother   . Stomach cancer Paternal Grandmother     Social History Social History   Tobacco Use  . Smoking status: Never Smoker  . Smokeless tobacco: Never Used  Substance Use Topics  . Alcohol use: Yes    Alcohol/week: 1.0 standard drinks    Types: 1 Standard drinks or equivalent per week  . Drug use: No    Review of Systems  Constitutional: No fever/chills but see HPI Eyes: No visual changes. ENT: No sore throat. Cardiovascular: Denies chest pain. Respiratory: Denies shortness of breath. Gastrointestinal: No abdominal pain.  No nausea, no vomiting.  No diarrhea.  No constipation. Genitourinary: Negative for dysuria. Musculoskeletal: Negative for back pain. Skin: Negative for rash. Neurological: Negative for headaches, focal weakness   ____________________________________________   PHYSICAL EXAM:  VITAL SIGNS: ED Triage Vitals  Enc Vitals Group  BP 10/30/18 2000 (!) 162/100     Pulse Rate 10/30/18 2000 (!) 102     Resp 10/30/18 2000 20     Temp 10/30/18 2000 98.3 F (36.8 C)     Temp Source 10/30/18 2000 Oral     SpO2 10/30/18 2000 97 %     Weight --      Height --      Head Circumference --      Peak Flow --      Pain Score 10/30/18 1959 8     Pain Loc --      Pain Edu? --      Excl. in [place]? --     Constitutional: Alert and oriented.  Complaining of pain in the left leg Eyes: Conjunctivae are normal.  Head: Atraumatic. Nose: No  congestion/rhinnorhea. Mouth/Throat: Mucous membranes are moist.  Oropharynx non-erythematous. Neck: No stridor.  Cardiovascular: Normal rate, regular rhythm. Grossly normal heart sounds.  Good peripheral circulation. Respiratory: Normal respiratory effort.  No retractions. Lungs CTAB. Gastrointestinal: Soft and nontender. No distention. No abdominal bruits. No CVA tenderness. Musculoskeletal: Right leg looks normal left leg is red hot tender with 2 insect bites on it.  The redness looks more like cellulitis though.  Patient reports it hurts him when she cannot really bear weight on it. Neurologic:  Normal speech and language. No gross focal neurologic deficits are appreciated. ] Skin:  Skin is warm, dry and intact except for on the leg. No rash noted except for on the leg.   ____________________________________________   LABS (all labs ordered are listed, but only abnormal results are displayed)  Labs Reviewed  CBC WITH DIFFERENTIAL/PLATELET - Abnormal; Notable for the following components:      Result Value   Neutro Abs 8.4 (*)    Lymphs Abs 0.5 (*)    All other components within normal limits  COMPREHENSIVE METABOLIC PANEL - Abnormal; Notable for the following components:   Sodium 132 (*)    CO2 20 (*)    Glucose, Bld 338 (*)    Total Protein 8.2 (*)    All other components within normal limits  LACTIC ACID, PLASMA - Abnormal; Notable for the following components:   Lactic Acid, Venous 2.9 (*)    All other components within normal limits  CULTURE, BLOOD (ROUTINE X 2)  CULTURE, BLOOD (ROUTINE X 2)  LACTIC ACID, PLASMA   ____________________________________________  EKG   ____________________________________________  RADIOLOGY  ED MD interpretation venous ultrasound shows no DVT per radiology  Official radiology report(s): US Venous Img Lower Unilateral Left  Result Date: 10/30/2018 CLINICAL DATA:  Swelling and edema for 1 day EXAM: Left LOWER EXTREMITY VENOUS  DOPPLER ULTRASOUND TECHNIQUE: Gray-scale sonography with graded compression, as well as color Doppler and duplex ultrasound were performed to evaluate the lower extremity deep venous systems from the level of the common femoral vein and including the common femoral, femoral, profunda femoral, popliteal and calf veins including the posterior tibial, peroneal and gastrocnemius veins when visible. The superficial great saphenous vein was also interrogated. Spectral Doppler was utilized to evaluate flow at rest and with distal augmentation maneuvers in the common femoral, femoral and popliteal veins. COMPARISON:  None. FINDINGS: Contralateral Common Femoral Vein: Respiratory phasicity is normal and symmetric with the symptomatic side. No evidence of thrombus. Normal compressibility. Common Femoral Vein: No evidence of thrombus. Normal compressibility, respiratory phasicity and response to augmentation. Saphenofemoral Junction: No evidence of thrombus. Normal compressibility and flow on color Doppler imaging. Profunda  Femoral Vein: No evidence of thrombus. Normal compressibility and flow on color Doppler imaging. Femoral Vein: No evidence of thrombus. Normal compressibility, respiratory phasicity and response to augmentation. Popliteal Vein: No evidence of thrombus. Normal compressibility, respiratory phasicity and response to augmentation. Calf Veins: Poorly visualized. Other Findings:  None. IMPRESSION: No evidence of deep venous thrombosis. Electronically Signed   By: Donavan Foil M.D.   On: 10/30/2018 21:03    ____________________________________________   PROCEDURES  Procedure(s) performed (including Critical Care):  Procedures   ____________________________________________   INITIAL IMPRESSION / ASSESSMENT AND PLAN / ED COURSE  Daisy Ross was evaluated in Emergency Department on 10/30/2018 for the symptoms described in the history of present illness. She was evaluated in the context of the  global COVID-19 pandemic, which necessitated consideration that the patient might be at risk for infection with the SARS-CoV-2 virus that causes COVID-19. Institutional protocols and algorithms that pertain to the evaluation of patients at risk for COVID-19 are in a state of rapid change based on information released by regulatory bodies including the CDC and federal and state organizations. These policies and algorithms were followed during the patient's care in the ED.     Patient has a normal white count but a marked shift to the left.  Additionally she has the episode of shaking chills.  Her lactic acid is elevated 2.9 and her heart rate is 102..  There is does appear to be cellulitis.  Patient reports she picked the scab off earlier from a spider bite.         ____________________________________________   FINAL CLINICAL IMPRESSION(S) / ED DIAGNOSES  Final diagnoses:  Leg swelling  Cellulitis of left lower extremity  Insect bite of left lower leg, initial encounter  Elevated lactic acid level     ED Discharge Orders    None       Note:  This document was prepared using Dragon voice recognition software and may include unintentional dictation errors.    Nena Polio, MD 10/30/18 2134

## 2018-10-30 NOTE — ED Notes (Signed)
Assumed care of patient reports spider and wasp bite. Seen by urgy care and give 2 different abx with no improvement. Positive swelling to left lower leg, good pulses noted. Unable to ambulate w/o pain.

## 2018-10-31 LAB — CBC
HCT: 38.3 % (ref 36.0–46.0)
Hemoglobin: 12.8 g/dL (ref 12.0–15.0)
MCH: 29.1 pg (ref 26.0–34.0)
MCHC: 33.4 g/dL (ref 30.0–36.0)
MCV: 87 fL (ref 80.0–100.0)
Platelets: 259 10*3/uL (ref 150–400)
RBC: 4.4 MIL/uL (ref 3.87–5.11)
RDW: 12.5 % (ref 11.5–15.5)
WBC: 11.2 10*3/uL — ABNORMAL HIGH (ref 4.0–10.5)
nRBC: 0 % (ref 0.0–0.2)

## 2018-10-31 LAB — LACTIC ACID, PLASMA: Lactic Acid, Venous: 2.9 mmol/L (ref 0.5–1.9)

## 2018-10-31 LAB — CREATININE, SERUM
Creatinine, Ser: 0.68 mg/dL (ref 0.44–1.00)
GFR calc Af Amer: >60 mL/min (ref >60)
GFR calc non Af Amer: >60 mL/min (ref >60)

## 2018-10-31 LAB — SARS CORONAVIRUS 2 (TAT 6-24 HRS): SARS Coronavirus 2: NEGATIVE

## 2018-10-31 LAB — GLUCOSE, CAPILLARY
Glucose-Capillary: 297 mg/dL — ABNORMAL HIGH (ref 70–99)
Glucose-Capillary: 211 mg/dL — ABNORMAL HIGH (ref 70–99)
Glucose-Capillary: 220 mg/dL — ABNORMAL HIGH (ref 70–99)
Glucose-Capillary: 214 mg/dL — ABNORMAL HIGH (ref 70–99)
Glucose-Capillary: 175 mg/dL — ABNORMAL HIGH (ref 70–99)
Glucose-Capillary: 111 mg/dL — ABNORMAL HIGH (ref 70–99)

## 2018-10-31 MED ORDER — INSULIN ASPART 100 UNIT/ML ~~LOC~~ SOLN
0.0000 [IU] | Freq: Three times a day (TID) | SUBCUTANEOUS | Status: DC
  Administered 2018-10-31: 3 [IU] via SUBCUTANEOUS
  Administered 2018-10-31: 2 [IU] via SUBCUTANEOUS
  Administered 2018-10-31: 3 [IU] via SUBCUTANEOUS
  Administered 2018-11-01: 1 [IU] via SUBCUTANEOUS
  Filled 2018-10-31 (×4): qty 1

## 2018-10-31 MED ORDER — INSULIN ASPART 100 UNIT/ML ~~LOC~~ SOLN: 0.0000 [IU] | Freq: Every day | SUBCUTANEOUS | Status: DC

## 2018-10-31 MED ORDER — MORPHINE SULFATE (PF) 2 MG/ML IV SOLN
1.0000 mg | INTRAVENOUS | Status: DC | PRN
  Administered 2018-10-31: 1 mg via INTRAVENOUS

## 2018-10-31 MED ORDER — MORPHINE SULFATE (PF) 2 MG/ML IV SOLN
INTRAVENOUS | Status: AC
  Filled 2018-10-31: qty 1

## 2018-10-31 MED ORDER — LIVING WELL WITH DIABETES BOOK
Freq: Once | Status: AC
  Administered 2018-10-31: 15:00:00
  Filled 2018-10-31: qty 1

## 2018-10-31 MED ORDER — OXYCODONE HCL 5 MG PO TABS
10.0000 mg | ORAL_TABLET | Freq: Four times a day (QID) | ORAL | Status: DC | PRN
  Administered 2018-11-01 – 2018-11-02 (×2): 10 mg via ORAL
  Filled 2018-10-31 (×2): qty 2

## 2018-10-31 MED ORDER — VANCOMYCIN HCL 1.25 G IV SOLR
1250.0000 mg | Freq: Two times a day (BID) | INTRAVENOUS | Status: DC
  Administered 2018-10-31 – 2018-11-02 (×5): 1250 mg via INTRAVENOUS
  Filled 2018-10-31 (×6): qty 1250

## 2018-10-31 MED ORDER — PIPERACILLIN-TAZOBACTAM 3.375 G IVPB
3.3750 g | Freq: Three times a day (TID) | INTRAVENOUS | Status: DC
  Administered 2018-10-31 – 2018-11-01 (×3): 3.375 g via INTRAVENOUS
  Filled 2018-10-31 (×3): qty 50

## 2018-10-31 NOTE — Progress Notes (Signed)
Pharmacy Antibiotic Note  Daisy Ross is a 43 y.o. female admitted on 10/30/2018 with cellulitis.  Pharmacy has been consulted for Vancomycin and Zosyn dosing.  Pt received Vancomycin 1000mg  and Zosyn 3.375gm in ED.  Pt did not receive a full loading dose of Vanc, will start 2nd dose sooner than 12 hr interval.    Plan: Zosyn 3.375g IV q8h (4 hour infusion).   Vancomycin 1250 mg IV Q 12 hrs. Goal AUC 400-550. Expected AUC: 501 SCr used: 0.8  Height: 5\' 8"  (172.7 cm) Weight: 193 lb 14.4 oz (88 kg) IBW/kg (Calculated) : 63.9  Temp (24hrs), Avg:98.4 F (36.9 C), Min:98.3 F (36.8 C), Max:98.4 F (36.9 C)  Recent Labs  Lab 10/30/18 2007 10/31/18 0018  WBC 9.0 11.2*  CREATININE 0.71 0.68  LATICACIDVEN 2.9*  --     Estimated Creatinine Clearance: 103 mL/min (by C-G formula based on SCr of 0.68 mg/dL).    Allergies  Allergen Reactions  . Tramadol Other (See Comments)  . Ultram [Tramadol Hcl]     Antimicrobials this admission: Zosyn 9/15 >>  Vancomycin 9/15 >>   Dose adjustments this admission:  Microbiology results:  BCx:   UCx:    Sputum:    MRSA PCR:   Thank you for allowing pharmacy to be a part of this patient's care.  Hart Robinsons A 10/31/2018 4:08 AM

## 2018-10-31 NOTE — Plan of Care (Signed)
  Problem: Education: Goal: Knowledge of General Education information will improve Description: Including pain rating scale, medication(s)/side effects and non-pharmacologic comfort measures Outcome: Progressing   Problem: Health Behavior/Discharge Planning: Goal: Ability to manage health-related needs will improve Outcome: Progressing   Problem: Clinical Measurements: Goal: Ability to maintain clinical measurements within normal limits will improve Outcome: Progressing   Problem: Pain Managment: Goal: General experience of comfort will improve Outcome: Progressing   Problem: Clinical Measurements: Goal: Ability to avoid or minimize complications of infection will improve Outcome: Progressing   Problem: Clinical Measurements: Goal: Respiratory complications will improve Outcome: Completed/Met Goal: Cardiovascular complication will be avoided Outcome: Completed/Met

## 2018-10-31 NOTE — Progress Notes (Signed)
Inpatient Diabetes Program Recommendations  AACE/ADA: New Consensus Statement on Inpatient Glycemic Control (2015)  Target Ranges:  Prepandial:   less than 140 mg/dL      Peak postprandial:   less than 180 mg/dL (1-2 hours)      Critically ill patients:  140 - 180 mg/dL   Lab Results  Component Value Date   GLUCAP 214 (H) 10/31/2018    Spoke with patient about new diabetes diagnosis.  Told patient about an A1c level and discussed that we were still waiting for her's to result.   Pt has strong family history of DM with "women in her family getting DM." Pateint has been told she has Pre DM and reports getting a steroid injection around 10am yest morning for her leg.   Discussed with patient about lifestyle modification. Pt reports gaining weight since the gyms closed for COVID. Pt also reports she has measuring cups at home to help with her diet modification.  Reviewed glucose and A1C goals. Mentioned that depending on her levels she may need oral medication for a short time and follow up with PCP she will go to at [facility]. Also spoke with patient about checking her glucose while on medication.  Discussed hypoglycemia s/s and treatment of it. Ordered pt living well with DM booklet. Discussed impact of nutrition, exercise, stress, sickness, and medications on diabetes control.   Patient understands information and has no other needs at this time.   Pt will need blood glucose meter kit (order # [id number]) at d/c.  Thanks, Tama Headings RN, MSN, BC-ADM Inpatient Diabetes Coordinator Team Pager [phone] (8a-5p)

## 2018-10-31 NOTE — Plan of Care (Signed)

## 2018-10-31 NOTE — Progress Notes (Signed)
Inpatient Diabetes Program Recommendations  AACE/ADA: New Consensus Statement on Inpatient Glycemic Control (2015)  Target Ranges:  Prepandial:   less than 140 mg/dL      Peak postprandial:   less than 180 mg/dL (1-2 hours)      Critically ill patients:  140 - 180 mg/dL   Lab Results  Component Value Date   GLUCAP 220 (H) 10/31/2018    Review of Glycemic Control Results for Daisy Ross, Daisy Ross (MRN [id number]) as of 10/31/2018 09:08  Ref. Range 10/31/2018 03:55 10/31/2018 05:52 10/31/2018 08:02  Glucose-Capillary Latest Ref Range: 70 - 99 mg/dL 297 (H) 211 (H) 220 (H)   Cellulitis of leg Diabetes history: Pre DM Outpatient Diabetes medications: None Current orders for Inpatient glycemic control: Novolog 0-9 units + hs  Inpatient Diabetes Program Recommendations:    Glucose trends in 200's. Please increase correction scale to Novolog 0-15 units tid.  Noted A1c pending, expecting to be elevated, pt may need oral DM medication at d/c.  Thanks,  Tama Headings RN, MSN, BC-ADM Inpatient Diabetes Coordinator Team Pager [phone] (8a-5p)

## 2018-10-31 NOTE — ED Notes (Signed)
ED TO INPATIENT HANDOFF REPORT  ED Nurse Name and Phone #: Lorriane Shire [phone]  S Name/Age/Gender Mickle Plumb 43 y.o. female Room/Bed: ED26A/ED26A  Code Status   Code Status: Full Code  Home/SNF/Other Home Patient oriented to: self, place, time and situation Is this baseline? Yes   Triage Complete: Triage complete  Chief Complaint Wasp Bite  Triage Note Pt reports wasp sting 3 days ago to left lower leg; currently taking clindamycin as rx by urgent care after taking augmentin for 1 day; st symptoms increased after 1 day so antibiotic was changed & received steroid inj as well; c/o persistent redness/swelling to lower leg   Allergies Allergies  Allergen Reactions  . Tramadol Other (See Comments)  . Ultram [Tramadol Hcl]     Level of Care/Admitting Diagnosis ED Disposition    ED Disposition Condition [facility] Area: [place] [[id number]]  Level of Care: Med-Surg [16]  Covid Evaluation: Asymptomatic Screening Protocol (No Symptoms)  Diagnosis: Cellulitis of left leg [id number]  Admitting Physician: Rufina Falco [facility] [id number]  Attending Physician: Rufina Falco ACHIENG [id number]  Estimated length of stay: past midnight tomorrow  Certification:: I certify this patient will need inpatient services for at least 2 midnights  PT Class (Do Not Modify): Inpatient [101]  PT Acc Code (Do Not Modify): Private [1]       B Medical/Surgery History Past Medical History:  Diagnosis Date  . History of bradycardia   . HSV-2 (herpes simplex virus 2) infection    Past Surgical History:  Procedure Laterality Date  . KNEE SURGERY       A IV Location/Drains/Wounds Patient Lines/Drains/Airways Status   Active Line/Drains/Airways    Name:   Placement date:   Placement time:   Site:   Days:   Peripheral IV 10/30/18 Left Antecubital   10/30/18    2051    Antecubital   1          Intake/Output Last 24 hours  Intake/Output Summary (Last  24 hours) at 10/31/2018 0314 Last data filed at 10/30/2018 2236 Gross per 24 hour  Intake 500 ml  Output -  Net 500 ml    Labs/Imaging Results for orders placed or performed during the hospital encounter of 10/30/18 (from the past 48 hour(s))  CBC with Differential     Status: Abnormal   Collection Time: 10/30/18  8:07 PM  Result Value Ref Range   WBC 9.0 4.0 - 10.5 K/uL   RBC 4.76 3.87 - 5.11 MIL/uL   Hemoglobin 14.0 12.0 - 15.0 g/dL   HCT 41.8 36.0 - 46.0 %   MCV 87.8 80.0 - 100.0 fL   MCH 29.4 26.0 - 34.0 pg   MCHC 33.5 30.0 - 36.0 g/dL   RDW 12.5 11.5 - 15.5 %   Platelets 274 150 - 400 K/uL   nRBC 0.0 0.0 - 0.2 %   Neutrophils Relative % 93 %   Neutro Abs 8.4 (H) 1.7 - 7.7 K/uL   Lymphocytes Relative 6 %   Lymphs Abs 0.5 (L) 0.7 - 4.0 K/uL   Monocytes Relative 1 %   Monocytes Absolute 0.1 0.1 - 1.0 K/uL   Eosinophils Relative 0 %   Eosinophils Absolute 0.0 0.0 - 0.5 K/uL   Basophils Relative 0 %   Basophils Absolute 0.0 0.0 - 0.1 K/uL   Immature Granulocytes 0 %   Abs Immature Granulocytes 0.03 0.00 - 0.07 K/uL    Comment: Performed at [facility],  [place], [place] [address]  Comprehensive metabolic panel     Status: Abnormal   Collection Time: 10/30/18  8:07 PM  Result Value Ref Range   Sodium 132 (L) 135 - 145 mmol/L   Potassium 3.8 3.5 - 5.1 mmol/L   Chloride 102 98 - 111 mmol/L   CO2 20 (L) 22 - 32 mmol/L   Glucose, Bld 338 (H) 70 - 99 mg/dL   BUN 10 6 - 20 mg/dL   Creatinine, Ser 0.71 0.44 - 1.00 mg/dL   Calcium 9.7 8.9 - 10.3 mg/dL   Total Protein 8.2 (H) 6.5 - 8.1 g/dL   Albumin 3.9 3.5 - 5.0 g/dL   AST 33 15 - 41 U/L   ALT 44 0 - 44 U/L   Alkaline Phosphatase 68 38 - 126 U/L   Total Bilirubin 0.6 0.3 - 1.2 mg/dL   GFR calc non Af Amer >60 >60 mL/min   GFR calc Af Amer >60 >60 mL/min   Anion gap 10 5 - 15    Comment: Performed at [facility], [address] [place], [place] [address]  Lactic acid, plasma      Status: Abnormal   Collection Time: 10/30/18  8:07 PM  Result Value Ref Range   Lactic Acid, Venous 2.9 (HH) 0.5 - 1.9 mmol/L    Comment: CRITICAL RESULT CALLED TO, READ BACK BY AND VERIFIED WITH JENNIFER INGERSOL 10/30/18 @ 2113  [place] Performed at [facility], [address] [place], [place] [address]   CBC     Status: Abnormal   Collection Time: 10/31/18 12:18 AM  Result Value Ref Range   WBC 11.2 (H) 4.0 - 10.5 K/uL   RBC 4.40 3.87 - 5.11 MIL/uL   Hemoglobin 12.8 12.0 - 15.0 g/dL   HCT 38.3 36.0 - 46.0 %   MCV 87.0 80.0 - 100.0 fL   MCH 29.1 26.0 - 34.0 pg   MCHC 33.4 30.0 - 36.0 g/dL   RDW 12.5 11.5 - 15.5 %   Platelets 259 150 - 400 K/uL   nRBC 0.0 0.0 - 0.2 %    Comment: Performed at [facility], [address] [place], [place] [address]  Creatinine, serum     Status: None   Collection Time: 10/31/18 12:18 AM  Result Value Ref Range   Creatinine, Ser 0.68 0.44 - 1.00 mg/dL   GFR calc non Af Amer >60 >60 mL/min   GFR calc Af Amer >60 >60 mL/min    Comment: Performed at [facility], [address] [place], [place] [address]   US Venous Img Lower Unilateral Left  Result Date: 10/30/2018 CLINICAL DATA:  Swelling and edema for 1 day EXAM: Left LOWER EXTREMITY VENOUS DOPPLER ULTRASOUND TECHNIQUE: Gray-scale sonography with graded compression, as well as color Doppler and duplex ultrasound were performed to evaluate the lower extremity deep venous systems from the level of the common femoral vein and including the common femoral, femoral, profunda femoral, popliteal and calf veins including the posterior tibial, peroneal and gastrocnemius veins when visible. The superficial great saphenous vein was also interrogated. Spectral Doppler was utilized to evaluate flow at rest and with distal augmentation maneuvers in the common femoral, femoral and popliteal veins. COMPARISON:  None. FINDINGS: Contralateral Common Femoral Vein: Respiratory phasicity is  normal and symmetric with the symptomatic side. No evidence of thrombus. Normal compressibility. Common Femoral Vein: No evidence of thrombus. Normal compressibility, respiratory phasicity and response to augmentation. Saphenofemoral Junction: No  evidence of thrombus. Normal compressibility and flow on color Doppler imaging. Profunda Femoral Vein: No evidence of thrombus. Normal compressibility and flow on color Doppler imaging. Femoral Vein: No evidence of thrombus. Normal compressibility, respiratory phasicity and response to augmentation. Popliteal Vein: No evidence of thrombus. Normal compressibility, respiratory phasicity and response to augmentation. Calf Veins: Poorly visualized. Other Findings:  None. IMPRESSION: No evidence of deep venous thrombosis. Electronically Signed   By: Donavan Foil M.D.   On: 10/30/2018 21:03    Pending Labs Unresulted Labs (From admission, onward)    Start     Ordered   11/06/18 0500  Creatinine, serum  (enoxaparin (LOVENOX)    CrCl >/= 30 ml/min)  Weekly,   STAT    Comments: while on enoxaparin therapy    10/30/18 2328   10/30/18 2315  HIV antibody (Routine Testing)  Once,   STAT     10/30/18 2328   10/30/18 2136  SARS CORONAVIRUS 2 (TAT 6-24 HRS) Nasopharyngeal Nasopharyngeal Swab  (Asymptomatic/Tier 2 Patients Labs)  Once,   STAT    Question Answer Comment  Is this test for diagnosis or screening Screening   Symptomatic for COVID-19 as defined by CDC No   Hospitalized for COVID-19 No   Admitted to ICU for COVID-19 No   Previously tested for COVID-19 Yes   Resident in a congregate (group) care setting No   Employed in healthcare setting No   Pregnant No      10/30/18 2135   10/30/18 2136  Pregnancy, urine  Once,   STAT     10/30/18 2135   10/30/18 2004  Lactic acid, plasma  Now then every 2 hours,   STAT     10/30/18 2003   10/30/18 2004  Blood culture (routine x 2)  BLOOD CULTURE X 2,   STAT     10/30/18 2003          Vitals/Pain Today's  Vitals   10/30/18 2226 10/31/18 0014 10/31/18 0030 10/31/18 0200  BP: (!) 158/90 124/68 124/81 119/84  Pulse: 86 83 82 72  Resp: 18     Temp:      TempSrc:      SpO2: 99% 97% 95% 95%  PainSc: 7        Isolation Precautions No active isolations  Medications Medications  valACYclovir (VALTREX) tablet 500 mg (has no administration in time range)  fluticasone (FLONASE) 50 MCG/ACT nasal spray 1 spray (has no administration in time range)  enoxaparin (LOVENOX) injection 40 mg (has no administration in time range)  0.9 %  sodium chloride infusion (has no administration in time range)  morphine 2 MG/ML injection 1 mg (1 mg Intravenous Given 10/31/18 0150)  oxyCODONE (Oxy IR/ROXICODONE) immediate release tablet 10 mg (has no administration in time range)  vancomycin (VANCOCIN) IVPB 1000 mg/200 mL premix (0 mg Intravenous Stopped 10/30/18 2236)  piperacillin-tazobactam (ZOSYN) IVPB 3.375 g (0 g Intravenous Stopped 10/31/18 0049)    Mobility walks       R Recommendations: See Admitting Provider Note  Report given to:   Additional Notes:

## 2018-10-31 NOTE — TOC Initial Note (Addendum)
Transition of Care [facility]) - Initial/Assessment Note    Patient Details  Name: Daisy Ross MRN: [id number] Date of Birth: Apr 30, 1975  [facility] CM/SW Contact:    Elza Rafter, RN Phone Number: 10/31/2018, 11:44 AM  Clinical Narrative:     Patient is from home alone.  Independent in all ADL's.   Stung by a wasp and spider bite to left leg several days ago.   Went to Urgent Care; states she had a "steroid shot" yesterday.  Recieving IVF and IV Zosyn and Vancomycin.  Elevated blood glucose-A1C pending.  States she has a strong family history of type 2 DM.     No PCP-will schedule new patient appointment with [facility] per patient request.  [facility]bt[facility] [address].   Geoffry Paradise, BSN, RN Care Manager [phone]         Expected Discharge Plan: Home/Self Care Barriers to Discharge: Continued Medical Work up   Patient Goals and CMS Choice        Expected Discharge Plan and Services Expected Discharge Plan: Home/Self Care   Discharge Planning Services: CM Consult                                          Prior Living Arrangements/Services   Lives with:: Self Patient language and need for interpreter reviewed:: Yes Do you feel safe going back to the place where you live?: Yes      Need for Family Participation in Patient Care: Yes (Comment)     Criminal Activity/Legal Involvement Pertinent to Current Situation/Hospitalization: No - Comment as needed  Activities of Daily Living Home Assistive Devices/Equipment: None ADL Screening (condition at time of admission) Patient's cognitive ability adequate to safely complete daily activities?: Yes Is the patient deaf or have difficulty hearing?: No Does the patient have difficulty seeing, even when wearing glasses/contacts?: No Does the patient have difficulty concentrating, remembering, or making decisions?: No Patient able to express need for assistance with ADLs?:  No Does the patient have difficulty dressing or bathing?: No Independently performs ADLs?: Yes (appropriate for developmental age) Does the patient have difficulty walking or climbing stairs?: No Weakness of Legs: Left Weakness of Arms/Hands: None  Permission Sought/Granted Permission sought to share information with : Case Manager                Emotional Assessment Appearance:: Appears stated age Attitude/Demeanor/Rapport: Gracious Affect (typically observed): Accepting Orientation: : Oriented to Self, Oriented to Place, Oriented to  Time, Oriented to Situation Alcohol / Substance Use: Not Applicable    Admission diagnosis:  Leg swelling [M79.89] Cellulitis of left lower extremity [L03.116] Elevated lactic acid level [R79.89] Insect bite of left lower leg, initial encounter [id number], W57.XXXA] Patient Active Problem List   Diagnosis Date Noted  . Cellulitis of left leg 10/30/2018  . Toxic effect of fiberglass 05/22/2017  . Rosacea 03/22/2017  . Elevated LFTs 08/27/2013  . Prediabetes 08/27/2013  . Preoperative cardiovascular examination 08/15/2012  . Atypical chest pain 10/23/2010  . Dizziness 10/23/2010  . Bradycardia 10/23/2010  . Fatigue 10/23/2010   PCP:  Patient, No Pcp Per Pharmacy:   [id number] [place], [place] [address] Phone: [phone] Fax: [phone]     Social Determinants of Health (SDOH) Interventions    Readmission Risk Interventions No flowsheet data  found.  

## 2018-10-31 NOTE — Progress Notes (Signed)
Patient ID: Daisy Ross, female   DOB: Oct 22, 1975, 43 y.o.   MRN: [id number]  [employer] PROGRESS NOTE  Daisy Ross [id number] DOB: November 23, 1975 DOA: 10/30/2018 PCP: Patient, No Pcp Per  HPI/Subjective: Patient states that she was on amoxicillin and then it was switched to Bactrim.  He states that she had a spider bite on her ankle and then after that a wasp sting on her shin.  She had some fever and chills at home.  Patient having pain in her left leg and worse when it is hanging down or trying to walk on it.  Objective: Vitals:   10/31/18 0337 10/31/18 0858  BP: 131/70 113/61  Pulse: 75 71  Resp:  18  Temp: 98.4 F (36.9 C) 98.8 F (37.1 C)  SpO2: 98% 98%    Intake/Output Summary (Last 24 hours) at 10/31/2018 1236 Last data filed at 10/31/2018 0807 Gross per 24 hour  Intake 500 ml  Output 500 ml  Net 0 ml   Filed Weights   10/31/18 0337  Weight: 88 kg    ROS: Review of Systems  Constitutional: Negative for chills and fever.  Eyes: Negative for blurred vision.  Respiratory: Negative for cough, shortness of breath and wheezing.   Cardiovascular: Negative for chest pain.  Gastrointestinal: Negative for abdominal pain, constipation, diarrhea, nausea and vomiting.  Genitourinary: Negative for dysuria.  Musculoskeletal: Positive for joint pain.  Neurological: Negative for dizziness and headaches.   Exam: Physical Exam  HENT:  Nose: No mucosal edema.  Mouth/Throat: No oropharyngeal exudate or posterior oropharyngeal edema.  Eyes: Pupils are equal, round, and reactive to light. Conjunctivae, EOM and lids are normal.  Neck: No JVD present. Carotid bruit is not present. No edema present. No thyroid mass and no thyromegaly present.  Cardiovascular: S1 normal and S2 normal. Exam reveals no gallop.  No murmur heard. Pulses:      Dorsalis pedis pulses are 2+ on the right side and 2+ on the left side.  Respiratory: No respiratory distress. She has no wheezes. She has  no rhonchi. She has no rales.  GI: Soft. Bowel sounds are normal. There is no abdominal tenderness.  Musculoskeletal:     Right ankle: She exhibits no swelling.     Left ankle: She exhibits swelling.  Lymphadenopathy:    She has no cervical adenopathy.  Neurological: She is alert. No cranial nerve deficit.  Skin: Skin is warm. Nails show no clubbing.  Left lower extremity erythema and warmth on the shin and around the ankle  Psychiatric: She has a normal mood and affect.      Data Reviewed: Basic Metabolic Panel: Recent Labs  Lab 10/30/18 2007 10/31/18 0018  NA 132*  --   K 3.8  --   CL 102  --   CO2 20*  --   GLUCOSE 338*  --   BUN 10  --   CREATININE 0.71 0.68  CALCIUM 9.7  --    Liver Function Tests: Recent Labs  Lab 10/30/18 2007  AST 33  ALT 44  ALKPHOS 68  BILITOT 0.6  PROT 8.2*  ALBUMIN 3.9   CBC: Recent Labs  Lab 10/30/18 2007 10/31/18 0018  WBC 9.0 11.2*  NEUTROABS 8.4*  --   HGB 14.0 12.8  HCT 41.8 38.3  MCV 87.8 87.0  PLT 274 259    CBG: Recent Labs  Lab 10/31/18 0355 10/31/18 0552 10/31/18 0802 10/31/18 1143  GLUCAP 297* 211* 220* 214*    Recent  Results (from the past 240 hour(s))  Blood culture (routine x 2)     Status: None (Preliminary result)   Collection Time: 10/30/18  8:17 PM   Specimen: BLOOD  Result Value Ref Range Status   Specimen Description BLOOD RIGHT ANTECUBITAL  Final   Special Requests   Final    BOTTLES DRAWN AEROBIC AND ANAEROBIC Blood Culture adequate volume   Culture   Final    NO GROWTH < 12 HOURS Performed at [facility], [address] [place], [place] [address]    Report Status PENDING  Incomplete  Blood culture (routine x 2)     Status: None (Preliminary result)   Collection Time: 10/30/18  9:24 PM   Specimen: BLOOD  Result Value Ref Range Status   Specimen Description BLOOD LEFT ANTECUBITAL  Final   Special Requests   Final    BOTTLES DRAWN AEROBIC AND ANAEROBIC Blood Culture adequate  volume   Culture   Final    NO GROWTH < 12 HOURS Performed at [facility], [address] [place], [place] [address]    Report Status PENDING  Incomplete     Studies: US Venous Img Lower Unilateral Left  Result Date: 10/30/2018 CLINICAL DATA:  Swelling and edema for 1 day EXAM: Left LOWER EXTREMITY VENOUS DOPPLER ULTRASOUND TECHNIQUE: Gray-scale sonography with graded compression, as well as color Doppler and duplex ultrasound were performed to evaluate the lower extremity deep venous systems from the level of the common femoral vein and including the common femoral, femoral, profunda femoral, popliteal and calf veins including the posterior tibial, peroneal and gastrocnemius veins when visible. The superficial great saphenous vein was also interrogated. Spectral Doppler was utilized to evaluate flow at rest and with distal augmentation maneuvers in the common femoral, femoral and popliteal veins. COMPARISON:  None. FINDINGS: Contralateral Common Femoral Vein: Respiratory phasicity is normal and symmetric with the symptomatic side. No evidence of thrombus. Normal compressibility. Common Femoral Vein: No evidence of thrombus. Normal compressibility, respiratory phasicity and response to augmentation. Saphenofemoral Junction: No evidence of thrombus. Normal compressibility and flow on color Doppler imaging. Profunda Femoral Vein: No evidence of thrombus. Normal compressibility and flow on color Doppler imaging. Femoral Vein: No evidence of thrombus. Normal compressibility, respiratory phasicity and response to augmentation. Popliteal Vein: No evidence of thrombus. Normal compressibility, respiratory phasicity and response to augmentation. Calf Veins: Poorly visualized. Other Findings:  None. IMPRESSION: No evidence of deep venous thrombosis. Electronically Signed   By: Donavan Foil M.D.   On: 10/30/2018 21:03    Scheduled Meds: . enoxaparin (LOVENOX) injection  40 mg Subcutaneous QHS  .  insulin aspart  0-5 Units Subcutaneous QHS  . insulin aspart  0-9 Units Subcutaneous TID WC  . living well with diabetes book   Does not apply Once  . valACYclovir  500 mg Oral Daily   Continuous Infusions: . sodium chloride 75 mL/hr at 10/31/18 0400  . piperacillin-tazobactam (ZOSYN)  IV 3.375 g (10/31/18 0904)  . vancomycin 1,250 mg (10/31/18 0908)    Assessment/Plan:  1. Cellulitis of the left lower extremity failed outpatient treatment.  Present on admission.  Patient states that the spider bit her ankle and a wasp stung her leg.  Patient put on aggressive antibiotics with vancomycin and Zosyn.  Elevation of leg and on 5 pillows.  Continue to monitor. 2. Lactic acidosis on IV fluid 3. New diagnosis of diabetes.  Hemoglobin A1c pending.  Currently on sliding scale insulin.  With lactic acidosis  would not start Glucophage at this time.  Hopefully can go home on oral medications.  Dietary and diabetes coordinator consultation.  I spoke with the patient about diet, exercise and weight loss.  Code Status:     Code Status Orders  (From admission, onward)         Start     Ordered   10/30/18 2328  Full code  Continuous     10/30/18 2328        Code Status History    This patient has a current code status but no historical code status.   Advance Care Planning Activity     Family Communication: Deferred me calling family at this time Disposition Plan: To be determined  Antibiotics:  Vancomycin  Zosyn  Time spent: 35 minutes  [place]

## 2018-10-31 NOTE — Plan of Care (Signed)
  Problem: Education: Goal: Knowledge of General Education information will improve Description: Including pain rating scale, medication(s)/side effects and non-pharmacologic comfort measures Outcome: Progressing Note: Patient profile completed. Patient has pain in the left leg that has improved from receiving morphine in the ED. Left leg is red, warm and edematous. Blood glucose level is elevated, [place] NP has been notified. Patient is resting. Will continue to monitor and assess.

## 2018-11-01 LAB — BASIC METABOLIC PANEL
Anion gap: 5 (ref 5–15)
BUN: 18 mg/dL (ref 6–20)
CO2: 23 mmol/L (ref 22–32)
Calcium: 8.2 mg/dL — ABNORMAL LOW (ref 8.9–10.3)
Chloride: 110 mmol/L (ref 98–111)
Creatinine, Ser: 0.63 mg/dL (ref 0.44–1.00)
GFR calc Af Amer: >60 mL/min (ref >60)
GFR calc non Af Amer: >60 mL/min (ref >60)
Glucose, Bld: 153 mg/dL — ABNORMAL HIGH (ref 70–99)
Potassium: 3.6 mmol/L (ref 3.5–5.1)
Sodium: 138 mmol/L (ref 135–145)

## 2018-11-01 LAB — CBC
HCT: 33.8 % — ABNORMAL LOW (ref 36.0–46.0)
Hemoglobin: 11.2 g/dL — ABNORMAL LOW (ref 12.0–15.0)
MCH: 29.3 pg (ref 26.0–34.0)
MCHC: 33.1 g/dL (ref 30.0–36.0)
MCV: 88.5 fL (ref 80.0–100.0)
Platelets: 265 10*3/uL (ref 150–400)
RBC: 3.82 MIL/uL — ABNORMAL LOW (ref 3.87–5.11)
RDW: 12.9 % (ref 11.5–15.5)
WBC: 16 10*3/uL — ABNORMAL HIGH (ref 4.0–10.5)
nRBC: 0 % (ref 0.0–0.2)

## 2018-11-01 LAB — GLUCOSE, CAPILLARY
Glucose-Capillary: 122 mg/dL — ABNORMAL HIGH (ref 70–99)
Glucose-Capillary: 95 mg/dL (ref 70–99)
Glucose-Capillary: 102 mg/dL — ABNORMAL HIGH (ref 70–99)
Glucose-Capillary: 96 mg/dL (ref 70–99)

## 2018-11-01 LAB — HIV ANTIBODY (ROUTINE TESTING W REFLEX): HIV Screen 4th Generation wRfx: NONREACTIVE

## 2018-11-01 LAB — HEMOGLOBIN A1C
Hgb A1c MFr Bld: 6.4 % — ABNORMAL HIGH (ref 4.8–5.6)
Mean Plasma Glucose: 137 mg/dL

## 2018-11-01 MED ORDER — SODIUM CHLORIDE 0.9 % IV SOLN
INTRAVENOUS | Status: DC | PRN
  Administered 2018-11-01: 250 mL via INTRAVENOUS

## 2018-11-01 MED ORDER — SODIUM CHLORIDE 0.9% FLUSH: 10.0000 mL | INTRAVENOUS | Status: DC | PRN

## 2018-11-01 NOTE — Progress Notes (Signed)
[facility] [place] NAME: Daisy Ross    MR#:  [id number]  DATE OF BIRTH:  1975/11/03  SUBJECTIVE:   Pt. Here due to left leg swelling and noted to mild cellulitis.  Still having some significant pain and swelling in that left leg but no other acute complaints.  REVIEW OF SYSTEMS:    Review of Systems  Constitutional: Negative for chills and fever.  HENT: Negative for congestion and tinnitus.   Eyes: Negative for blurred vision and double vision.  Respiratory: Negative for cough, shortness of breath and wheezing.   Cardiovascular: Positive for leg swelling. Negative for chest pain, orthopnea and PND.  Gastrointestinal: Negative for abdominal pain, diarrhea, nausea and vomiting.  Genitourinary: Negative for dysuria and hematuria.  Neurological: Negative for dizziness, sensory change and focal weakness.  All other systems reviewed and are negative.   Nutrition: Carb modified Tolerating Diet: Yes Tolerating PT: Ambulatory  DRUG ALLERGIES:   Allergies  Allergen Reactions  . Tramadol Other (See Comments)  . Ultram [Tramadol Hcl]     VITALS:  Blood pressure 122/71, pulse (!) 46, temperature 98.3 F (36.8 C), temperature source Oral, resp. rate 19, height 5\' 8"  (1.727 m), weight 88 kg, last menstrual period 09/29/2018, SpO2 100 %.  PHYSICAL EXAMINATION:   Physical Exam  GENERAL:  43 y.o.-year-old patient lying in bed in no acute distress.  EYES: Pupils equal, round, reactive to light and accommodation. No scleral icterus. Extraocular muscles intact.  HEENT: Head atraumatic, normocephalic. Oropharynx and nasopharynx clear.  NECK:  Supple, no jugular venous distention. No thyroid enlargement, no tenderness.  LUNGS: Normal breath sounds bilaterally, no wheezing, rales, rhonchi. No use of accessory muscles of respiration.  CARDIOVASCULAR: S1, S2 normal. No murmurs, rubs, or gallops.  ABDOMEN: Soft, nontender, nondistended. Bowel  sounds present. No organomegaly or mass.  EXTREMITIES: No cyanosis, clubbing or edema b/l.  Slight LLE redness, swelling.      NEUROLOGIC: Cranial nerves II through XII are intact. No focal Motor or sensory deficits b/l.   PSYCHIATRIC: The patient is alert and oriented x 3.  SKIN: No obvious rash, lesion, or ulcer.    LABORATORY PANEL:   CBC Recent Labs  Lab 11/01/18 0346  WBC 16.0*  HGB 11.2*  HCT 33.8*  PLT 265   ------------------------------------------------------------------------------------------------------------------  Chemistries  Recent Labs  Lab 10/30/18 2007  11/01/18 0346  NA 132*  --  138  K 3.8  --  3.6  CL 102  --  110  CO2 20*  --  23  GLUCOSE 338*  --  153*  BUN 10  --  18  CREATININE 0.71   < > 0.63  CALCIUM 9.7  --  8.2*  AST 33  --   --   ALT 44  --   --   ALKPHOS 68  --   --   BILITOT 0.6  --   --    < > = values in this interval not displayed.   ------------------------------------------------------------------------------------------------------------------  Cardiac Enzymes No results for input(s): TROPONINI in the last 168 hours. ------------------------------------------------------------------------------------------------------------------  RADIOLOGY:  US Venous Img Lower Unilateral Left  Result Date: 10/30/2018 CLINICAL DATA:  Swelling and edema for 1 day EXAM: Left LOWER EXTREMITY VENOUS DOPPLER ULTRASOUND TECHNIQUE: Gray-scale sonography with graded compression, as well as color Doppler and duplex ultrasound were performed to evaluate the lower extremity deep venous systems from the level of the common femoral vein and including the common  femoral, femoral, profunda femoral, popliteal and calf veins including the posterior tibial, peroneal and gastrocnemius veins when visible. The superficial great saphenous vein was also interrogated. Spectral Doppler was utilized to evaluate flow at rest and with distal augmentation maneuvers in the  common femoral, femoral and popliteal veins. COMPARISON:  None. FINDINGS: Contralateral Common Femoral Vein: Respiratory phasicity is normal and symmetric with the symptomatic side. No evidence of thrombus. Normal compressibility. Common Femoral Vein: No evidence of thrombus. Normal compressibility, respiratory phasicity and response to augmentation. Saphenofemoral Junction: No evidence of thrombus. Normal compressibility and flow on color Doppler imaging. Profunda Femoral Vein: No evidence of thrombus. Normal compressibility and flow on color Doppler imaging. Femoral Vein: No evidence of thrombus. Normal compressibility, respiratory phasicity and response to augmentation. Popliteal Vein: No evidence of thrombus. Normal compressibility, respiratory phasicity and response to augmentation. Calf Veins: Poorly visualized. Other Findings:  None. IMPRESSION: No evidence of deep venous thrombosis. Electronically Signed   By: Donavan Foil M.D.   On: 10/30/2018 21:03     ASSESSMENT AND PLAN:   43 year old female who presents to the hospital due to left lower extremity swelling redness.  1.  Left lower extremity cellulitis-source of patient's redness swelling and pain. - Afebrile, white cell count slightly elevated today but patient received some steroids as an outpatient. - Continue vancomycin, will DC Zosyn for now.  Follow cultures which remain negative so far.  Dopplers of lower extremities were negative for DVT.  2.  Leukocytosis-suspected to be secondary to cellulitis.  Follow with IV antibiotic therapy.  3.  Hyperglycemia- suspected to be secondary new onset DM.  Patient's A1c is 6.4 and she does have a family history of diabetes.  Continue sliding scale insulin for now. - may need to start pt. On Metformin upon discharge.     All the records are reviewed and case discussed with Care Management/Social Worker. Management plans discussed with the patient, family and they are in agreement.  CODE  STATUS: Full code  DVT Prophylaxis: Lovenox  TOTAL TIME TAKING CARE OF THIS PATIENT: 30 minutes.   POSSIBLE D/C IN 1-2 DAYS, DEPENDING ON CLINICAL CONDITION.   Henreitta Leber M.D on 11/01/2018 at 2:32 PM  Between 7am to 6pm - Pager - [phone]  After 6pm go to [email or web address] - Proofreader  [facility]  Office  [phone]  CC: Primary care physician; Patient, No Pcp Per

## 2018-11-01 NOTE — Plan of Care (Signed)
Nutrition Education Note   RD consulted for nutrition education regarding diabetes.   Lab Results  Component Value Date   HGBA1C 6.4 (H) 10/31/2018    RD provided "Nutrition and Type II Diabetes" handout from the Academy of Nutrition and Dietetics. Discussed different food groups and their effects on blood sugar, emphasizing carbohydrate-containing foods. Provided list of carbohydrates and recommended serving sizes of common foods.  Discussed importance of controlled and consistent carbohydrate intake throughout the day. Provided examples of ways to balance meals/snacks and encouraged intake of high-fiber, whole grain complex carbohydrates. Teach back method used.  Expect good compliance.  Body mass index is 29.48 kg/m. Pt meets criteria for overweight based on current BMI.  Current diet order is CHO modified, patient is consuming approximately 100% of meals at this time. Labs and medications reviewed. No further nutrition interventions warranted at this time. RD contact information provided. If additional nutrition issues arise, please re-consult RD.  Koleen Distance MS, RD, LDN Pager #- [phone] Office#- [phone] After Hours Pager: [phone]

## 2018-11-02 DIAGNOSIS — L03116 Cellulitis of left lower limb: Secondary | ICD-10-CM

## 2018-11-02 HISTORY — DX: Cellulitis of left lower limb: L03.116

## 2018-11-02 LAB — CBC
HCT: 33.3 % — ABNORMAL LOW (ref 36.0–46.0)
Hemoglobin: 11.3 g/dL — ABNORMAL LOW (ref 12.0–15.0)
MCH: 29.4 pg (ref 26.0–34.0)
MCHC: 33.9 g/dL (ref 30.0–36.0)
MCV: 86.7 fL (ref 80.0–100.0)
Platelets: 261 10*3/uL (ref 150–400)
RBC: 3.84 MIL/uL — ABNORMAL LOW (ref 3.87–5.11)
RDW: 12.8 % (ref 11.5–15.5)
WBC: 9.9 10*3/uL (ref 4.0–10.5)
nRBC: 0 % (ref 0.0–0.2)

## 2018-11-02 LAB — LACTIC ACID, PLASMA: Lactic Acid, Venous: 0.9 mmol/L (ref 0.5–1.9)

## 2018-11-02 LAB — GLUCOSE, CAPILLARY: Glucose-Capillary: 117 mg/dL — ABNORMAL HIGH (ref 70–99)

## 2018-11-02 MED ORDER — DOXYCYCLINE MONOHYDRATE 100 MG PO CAPS: 100.0000 mg | ORAL_CAPSULE | Freq: Two times a day (BID) | ORAL | 0 refills | Status: AC

## 2018-11-02 NOTE — Progress Notes (Signed)
[place] was admitted to the Hospital on 10/30/2018 and Discharged  11/02/2018 and should be excused from work/school   for 5 days starting 10/30/2018 , may return to work/school without any restrictions.  Call Abel Presto MD, [facility]  [phone] with questions.  Henreitta Leber M.D on 11/02/2018,at 9:43 AM

## 2018-11-02 NOTE — Progress Notes (Signed)
Inpatient Diabetes Program Recommendations  AACE/ADA: New Consensus Statement on Inpatient Glycemic Control (2015)  Target Ranges:  Prepandial:   less than 140 mg/dL      Peak postprandial:   less than 180 mg/dL (1-2 hours)      Critically ill patients:  140 - 180 mg/dL   Lab Results  Component Value Date   GLUCAP 117 (H) 11/02/2018   HGBA1C 6.4 (H) 10/31/2018    Review of Glycemic Control  Diabetes history: Pre DM, A1c 6.4%  Current orders for Inpatient glycemic control: Novolog 0-9 units tid + hs  Inpatient Diabetes Program Recommendations:    Pt received 1 units of Novolog in the last 24 hours. Suggest pt follow up with PCP without oral DM medications at this time. Pt may discuss with her PCP if she requires a glucose monitor outpatient.  Thanks,  Tama Headings RN, MSN, BC-ADM Inpatient Diabetes Coordinator Team Pager [phone] (8a-5p)

## 2018-11-02 NOTE — Discharge Summary (Signed)
[facility] [place] NAME: Daisy Ross    MR#:  [id number]  DATE OF BIRTH:  01/15/1976  DATE OF ADMISSION:  10/30/2018 ADMITTING PHYSICIAN: Lang Snow, NP  DATE OF DISCHARGE: 11/02/2018 11:24 AM  PRIMARY CARE PHYSICIAN: Patient, No Pcp Per    ADMISSION DIAGNOSIS:  Leg swelling [M79.89] Cellulitis of left lower extremity [L03.116] Elevated lactic acid level [R79.89] Insect bite of left lower leg, initial encounter [id number], W57.XXXA]  DISCHARGE DIAGNOSIS:  Active Problems:   Cellulitis of left leg   SECONDARY DIAGNOSIS:   Past Medical History:  Diagnosis Date  . History of bradycardia   . HSV-2 (herpes simplex virus 2) infection     HOSPITAL COURSE:   43 year old female who presents to the hospital due to left lower extremity swelling redness.  1.  Left lower extremity cellulitis-source of patient's redness swelling and pain. -Patient was treated with IV vancomycin, Zosyn.  Clinically much improved.  Redness has resolved, afebrile, hemodynamically stable.  Cultures remain negative.  White cell count has normalized.  Patient's Dopplers of lower extremity were negative for DVT. -Patient is now being discharged on oral doxycycline for 7 more days.  2.  Leukocytosis- due to the cellulitis as mentioned above.  Now normalized.  3.  Hyperglycemia- suspected to be secondary new onset DM/glucose intolerance.  Patient's A1c is 6.4 and she does have a family history of diabetes.   -While in the hospital patient was maintained on sliding scale insulin.  Blood sugars have remained fairly stable therefore patient was not discharged on any oral hypoglycemics and this should be further addressed as an outpatient with her PCP.  DISCHARGE CONDITIONS:   Stable.   CONSULTS OBTAINED:    DRUG ALLERGIES:   Allergies  Allergen Reactions  . Tramadol Other (See Comments)  . Ultram [Tramadol Hcl]     DISCHARGE MEDICATIONS:    Allergies as of 11/02/2018      Reactions   Tramadol Other (See Comments)   Ultram [tramadol Hcl]       Medication List    STOP taking these medications   amoxicillin-clavulanate 875-125 MG tablet Commonly known as: AUGMENTIN   clindamycin 300 MG capsule Commonly known as: CLEOCIN   HYDROcodone-acetaminophen 5-325 MG tablet Commonly known as: Norco   ibuprofen 600 MG tablet Commonly known as: ADVIL   mupirocin ointment 2 % Commonly known as: Bactroban   terbinafine 250 MG tablet Commonly known as: LamISIL     TAKE these medications   doxycycline 100 MG capsule Commonly known as: MONODOX Take 1 capsule (100 mg total) by mouth 2 (two) times daily for 7 days.   fluticasone 50 MCG/ACT nasal spray Commonly known as: FLONASE Place 1 spray into both nostrils daily as needed.   valACYclovir 500 MG tablet Commonly known as: VALTREX TAKE 1 TABLET BY MOUTH EVERY DAY         DISCHARGE INSTRUCTIONS:   DIET:  Regular diet  DISCHARGE CONDITION:  Stable  ACTIVITY:  Activity as tolerated  OXYGEN:  Home Oxygen: No.   Oxygen Delivery: room air  DISCHARGE LOCATION:  home   If you experience worsening of your admission symptoms, develop shortness of breath, life threatening emergency, suicidal or homicidal thoughts you must seek medical attention immediately by calling 911 or calling your MD immediately  if symptoms less severe.  You Must read complete instructions/literature along with all the possible adverse reactions/side effects for all the Medicines you take and that have  been prescribed to you. Take any new Medicines after you have completely understood and accpet all the possible adverse reactions/side effects.   Please note  You were cared for by a hospitalist during your hospital stay. If you have any questions about your discharge medications or the care you received while you were in the hospital after you are discharged, you can call the unit and  asked to speak with the hospitalist on call if the hospitalist that took care of you is not available. Once you are discharged, your primary care physician will handle any further medical issues. Please note that NO REFILLS for any discharge medications will be authorized once you are discharged, as it is imperative that you return to your primary care physician (or establish a relationship with a primary care physician if you do not have one) for your aftercare needs so that they can reassess your need for medications and monitor your lab values.     Today   Afebrile, white cell count normalized.  Lactic acid is normal.  Left leg looks less warm and red.  Will discharge home on oral antibiotics today.  VITAL SIGNS:  Blood pressure 121/72, pulse (!) 54, temperature 98.1 F (36.7 C), temperature source Oral, resp. rate 18, height 5\' 8"  (1.727 m), weight 88 kg, last menstrual period 09/29/2018, SpO2 98 %.  I/O:    Intake/Output Summary (Last 24 hours) at 11/02/2018 1542 Last data filed at 11/02/2018 1006 Gross per 24 hour  Intake 360 ml  Output 900 ml  Net -540 ml    PHYSICAL EXAMINATION:   GENERAL:  43 y.o.-year-old patient lying in bed in no acute distress.  EYES: Pupils equal, round, reactive to light and accommodation. No scleral icterus. Extraocular muscles intact.  HEENT: Head atraumatic, normocephalic. Oropharynx and nasopharynx clear.  NECK:  Supple, no jugular venous distention. No thyroid enlargement, no tenderness.  LUNGS: Normal breath sounds bilaterally, no wheezing, rales, rhonchi. No use of accessory muscles of respiration.  CARDIOVASCULAR: S1, S2 normal. No murmurs, rubs, or gallops.  ABDOMEN: Soft, nontender, nondistended. Bowel sounds present. No organomegaly or mass.  EXTREMITIES: No cyanosis, clubbing or edema b/l.  Slight LLE redness but improved.      NEUROLOGIC: Cranial nerves II through XII are intact. No focal Motor or sensory deficits b/l.   PSYCHIATRIC: The  patient is alert and oriented x 3.  SKIN: No obvious rash, lesion, or ulcer.   DATA REVIEW:   CBC Recent Labs  Lab 11/02/18 0600  WBC 9.9  HGB 11.3*  HCT 33.3*  PLT 261    Chemistries  Recent Labs  Lab 10/30/18 2007  11/01/18 0346  NA 132*  --  138  K 3.8  --  3.6  CL 102  --  110  CO2 20*  --  23  GLUCOSE 338*  --  153*  BUN 10  --  18  CREATININE 0.71   < > 0.63  CALCIUM 9.7  --  8.2*  AST 33  --   --   ALT 44  --   --   ALKPHOS 68  --   --   BILITOT 0.6  --   --    < > = values in this interval not displayed.    Cardiac Enzymes No results for input(s): TROPONINI in the last 168 hours.  Microbiology Results  Results for orders placed or performed during the hospital encounter of 10/30/18  Blood culture (routine x 2)  Status: None (Preliminary result)   Collection Time: 10/30/18  8:17 PM   Specimen: BLOOD  Result Value Ref Range Status   Specimen Description BLOOD RIGHT ANTECUBITAL  Final   Special Requests   Final    BOTTLES DRAWN AEROBIC AND ANAEROBIC Blood Culture adequate volume   Culture   Final    NO GROWTH 3 DAYS Performed at [facility], [address], [place], [place] [address]    Report Status PENDING  Incomplete  Blood culture (routine x 2)     Status: None (Preliminary result)   Collection Time: 10/30/18  9:24 PM   Specimen: BLOOD  Result Value Ref Range Status   Specimen Description BLOOD LEFT ANTECUBITAL  Final   Special Requests   Final    BOTTLES DRAWN AEROBIC AND ANAEROBIC Blood Culture adequate volume   Culture   Final    NO GROWTH 3 DAYS Performed at [facility], [address] [place], [place] [address]    Report Status PENDING  Incomplete  SARS CORONAVIRUS 2 (TAT 6-24 HRS) Nasopharyngeal Nasopharyngeal Swab     Status: None   Collection Time: 10/30/18 10:09 PM   Specimen: Nasopharyngeal Swab  Result Value Ref Range Status   SARS Coronavirus 2 NEGATIVE NEGATIVE Final    Comment: (NOTE) SARS-CoV-2  target nucleic acids are NOT DETECTED. The SARS-CoV-2 RNA is generally detectable in upper and lower respiratory specimens during the acute phase of infection. Negative results do not preclude SARS-CoV-2 infection, do not rule out co-infections with other pathogens, and should not be used as the sole basis for treatment or other patient management decisions. Negative results must be combined with clinical observations, patient history, and epidemiological information. The expected result is Negative. Fact Sheet for Patients: SugarRoll.be Fact Sheet for Healthcare Providers: https://www.woods-mathews.com/ This test is not yet approved or cleared by the [place] FDA and  has been authorized for detection and/or diagnosis of SARS-CoV-2 by FDA under an Emergency Use Authorization (EUA). This EUA will remain  in effect (meaning this test can be used) for the duration of the COVID-19 declaration under Section 56 4(b)(1) of the Act, 21 U.S.C. section 360bbb-3(b)(1), unless the authorization is terminated or revoked sooner. Performed at [facility], [place] [address], [place], [place] [address]     RADIOLOGY:  No results found.    Management plans discussed with the patient, family and they are in agreement.  CODE STATUS:  Code Status History    Date Active Date Inactive Code Status Order ID Comments User Context   10/30/2018 2328 11/02/2018 1424 Full Code [id number]  Lang Snow, NP ED   Advance Care Planning Activity      TOTAL TIME TAKING CARE OF THIS PATIENT: 40 minutes.    Henreitta Leber M.D on 11/02/2018 at 3:42 PM  Between 7am to 6pm - Pager - [phone]  After 6pm go to [email or web address] - Proofreader  [facility]  Office  [phone]  CC: Primary care physician; Patient, No Pcp Per

## 2018-11-04 LAB — CULTURE, BLOOD (ROUTINE X 2)
Culture: NO GROWTH
Culture: NO GROWTH
Special Requests: ADEQUATE
Special Requests: ADEQUATE

## 2019-03-28 ENCOUNTER — Encounter: Payer: Self-pay | Admitting: Emergency Medicine

## 2019-03-28 ENCOUNTER — Other Ambulatory Visit: Payer: Self-pay

## 2019-03-28 ENCOUNTER — Emergency Department: Payer: BC Managed Care – PPO

## 2019-03-28 DIAGNOSIS — K047 Periapical abscess without sinus: Secondary | ICD-10-CM | POA: Diagnosis not present

## 2019-03-28 DIAGNOSIS — Z79899 Other long term (current) drug therapy: Secondary | ICD-10-CM | POA: Diagnosis not present

## 2019-03-28 DIAGNOSIS — R0789 Other chest pain: Secondary | ICD-10-CM | POA: Diagnosis not present

## 2019-03-28 DIAGNOSIS — R5383 Other fatigue: Secondary | ICD-10-CM | POA: Insufficient documentation

## 2019-03-28 DIAGNOSIS — R5381 Other malaise: Secondary | ICD-10-CM | POA: Diagnosis not present

## 2019-03-28 LAB — CBC
HCT: 38.4 % (ref 36.0–46.0)
Hemoglobin: 13 g/dL (ref 12.0–15.0)
MCH: 29.4 pg (ref 26.0–34.0)
MCHC: 33.9 g/dL (ref 30.0–36.0)
MCV: 86.9 fL (ref 80.0–100.0)
Platelets: 299 10*3/uL (ref 150–400)
RBC: 4.42 MIL/uL (ref 3.87–5.11)
RDW: 12.4 % (ref 11.5–15.5)
WBC: 6.6 10*3/uL (ref 4.0–10.5)
nRBC: 0 % (ref 0.0–0.2)

## 2019-03-28 LAB — LACTIC ACID, PLASMA: Lactic Acid, Venous: 2.3 mmol/L (ref 0.5–1.9)

## 2019-03-28 LAB — BASIC METABOLIC PANEL
Anion gap: 10 (ref 5–15)
BUN: 16 mg/dL (ref 6–20)
CO2: 23 mmol/L (ref 22–32)
Calcium: 9 mg/dL (ref 8.9–10.3)
Chloride: 105 mmol/L (ref 98–111)
Creatinine, Ser: 0.73 mg/dL (ref 0.44–1.00)
GFR calc Af Amer: >60 mL/min (ref >60)
GFR calc non Af Amer: >60 mL/min (ref >60)
Glucose, Bld: 139 mg/dL — ABNORMAL HIGH (ref 70–99)
Potassium: 3.7 mmol/L (ref 3.5–5.1)
Sodium: 138 mmol/L (ref 135–145)

## 2019-03-28 LAB — TROPONIN I (HIGH SENSITIVITY): Troponin I (High Sensitivity): <2 ng/L (ref <18)

## 2019-03-28 NOTE — ED Triage Notes (Addendum)
Pt reports sudden onset of fatigue, central chest tightness that is causing pt to be SOB. This episode occurred approx. 45 mins ago. ((Pt reports she has an infected tooth to the top right side of mouth, that has been draining and swelling x3 days.)

## 2019-03-29 ENCOUNTER — Emergency Department
Admission: EM | Admit: 2019-03-29 | Discharge: 2019-03-29 | Disposition: A | Discharge status: Home / Self Care | Payer: BC Managed Care – PPO | Attending: Emergency Medicine | Admitting: Emergency Medicine

## 2019-03-29 DIAGNOSIS — R5381 Other malaise: Secondary | ICD-10-CM

## 2019-03-29 DIAGNOSIS — K047 Periapical abscess without sinus: Secondary | ICD-10-CM

## 2019-03-29 DIAGNOSIS — R0789 Other chest pain: Secondary | ICD-10-CM

## 2019-03-29 LAB — LACTIC ACID, PLASMA: Lactic Acid, Venous: 1 mmol/L (ref 0.5–1.9)

## 2019-03-29 MED ORDER — SODIUM CHLORIDE 0.9 % IV BOLUS
1000.0000 mL | Freq: Once | INTRAVENOUS | Status: AC
  Administered 2019-03-29: 1000 mL via INTRAVENOUS

## 2019-03-29 NOTE — ED Notes (Signed)
Lactic acid drawn by this RN after fluid bolus finished.

## 2019-03-29 NOTE — ED Provider Notes (Signed)
[facility] Emergency Department Provider Note  ____________________________________________   First MD Initiated Contact with Patient 03/29/19 [phone]     (approximate)  I have reviewed the triage vital signs and the nursing notes.   HISTORY  Chief Complaint Chest Pain and Fatigue    HPI Daisy Ross is a 44 y.o. female with no contributory past medical history who presents for evaluation of general malaise, fatigue, some central chest tightness, and some shortness of breath earlier today.  She said the onset was acute and the symptoms were severe and accompanied with feeling flushed and nauseated.  It occurred while she was at work.  She says she does not suffer from anxiety and did not feel particular overwhelmed.  She said that it was very hot at work.  She has not been vomiting.  She denies abdominal pain.  She has an infected tooth on the top right part of her mouth and she has been taking Augmentin or amoxicillin (one of the 2) for last few days and she has plans to see a dentist.  No significant facial swelling.  She denies fever.  She has been tested for COVID-19 about 7 times in the past and has always been negative and does not want repeat testing today.  She is feeling a little bit better, still fatigued with general malaise, but none of the other symptoms.  No history of heart disease.         Past Medical History:  Diagnosis Date  . History of bradycardia   . HSV-2 (herpes simplex virus 2) infection     Patient Active Problem List   Diagnosis Date Noted  . Cellulitis of left leg 10/30/2018  . Toxic effect of fiberglass 05/22/2017  . Rosacea 03/22/2017  . Elevated LFTs 08/27/2013  . Prediabetes 08/27/2013  . Preoperative cardiovascular examination 08/15/2012  . Atypical chest pain 10/23/2010  . Dizziness 10/23/2010  . Bradycardia 10/23/2010  . Fatigue 10/23/2010    Past Surgical History:  Procedure Laterality Date  . KNEE SURGERY        Prior to Admission medications   Medication Sig Start Date End Date Taking? Authorizing Provider  fluticasone (FLONASE) 50 MCG/ACT nasal spray Place 1 spray into both nostrils daily as needed.  04/25/17   [provider]  valACYclovir (VALTREX) 500 MG tablet TAKE 1 TABLET BY MOUTH EVERY DAY 07/20/18   Schuman, Stefanie Libel, MD    Allergies Tramadol, Ultram [tramadol hcl], and Wasp venom  Family History  Problem Relation Age of Onset  . Heart disease Mother   . Multiple myeloma Maternal Aunt   . Breast cancer Maternal Grandmother   . Lung cancer Paternal Grandmother   . Stomach cancer Paternal Grandmother     Social History Social History   Tobacco Use  . Smoking status: Never Smoker  . Smokeless tobacco: Never Used  Substance Use Topics  . Alcohol use: Yes    Alcohol/week: 1.0 standard drinks    Types: 1 Standard drinks or equivalent per week  . Drug use: No    Review of Systems Constitutional: General malaise and fatigue.  No fever/chills Eyes: No visual changes. ENT: No sore throat. Cardiovascular: Brief chest pain. Respiratory: Brief shortness of breath. Gastrointestinal: No abdominal pain.  nausea now resolved, no vomiting.  No diarrhea.  No constipation. Genitourinary: Negative for dysuria. Musculoskeletal: Negative for neck pain.  Negative for back pain. Integumentary: Negative for rash. Neurological: Negative for headaches, focal weakness or numbness.  ____________________________________________   PHYSICAL EXAM:  VITAL SIGNS: ED Triage Vitals  Enc Vitals Group     BP 03/28/19 2159 (!) 143/99     Pulse Rate 03/28/19 2159 87     Resp 03/28/19 2159 17     Temp 03/28/19 2203 98.5 F (36.9 C)     Temp Source 03/28/19 2159 Oral     SpO2 03/28/19 2159 100 %     Weight --      Height --      Head Circumference --      Peak Flow --      Pain Score --      Pain Loc --      Pain Edu? --      Excl. in [place]? --     Constitutional: Alert and  oriented.  No acute distress. Eyes: Conjunctivae are normal.  Head: Atraumatic. Nose: No congestion/rhinnorhea. Mouth/Throat: Patient is wearing a mask. Neck: No stridor.  No meningeal signs.   Cardiovascular: Normal rate, regular rhythm. Good peripheral circulation. Grossly normal heart sounds. Respiratory: Normal respiratory effort.  No retractions. Gastrointestinal: Soft and nontender. No distention.  Musculoskeletal: No lower extremity tenderness nor edema. No gross deformities of extremities. Neurologic:  Normal speech and language. No gross focal neurologic deficits are appreciated.  Skin:  Skin is warm, dry and intact. Psychiatric: Mood and affect are normal. Speech and behavior are normal.  ____________________________________________   LABS (all labs ordered are listed, but only abnormal results are displayed)  Labs Reviewed  BASIC METABOLIC PANEL - Abnormal; Notable for the following components:      Result Value   Glucose, Bld 139 (*)    All other components within normal limits  LACTIC ACID, PLASMA - Abnormal; Notable for the following components:   Lactic Acid, Venous 2.3 (*)    All other components within normal limits  CBC  LACTIC ACID, PLASMA  LACTIC ACID, PLASMA  POC URINE PREG, ED  TROPONIN I (HIGH SENSITIVITY)   ____________________________________________  EKG  ED ECG REPORT I, Hinda Kehr, the attending physician, personally viewed and interpreted this ECG.  Date: 03/28/2019 EKG Time: 22: 02 Rate: 81 Rhythm: normal sinus rhythm QRS Axis: RAD Intervals: normal ST/T Wave abnormalities: normal Narrative Interpretation: no evidence of acute ischemia  ____________________________________________  RADIOLOGY I, Hinda Kehr, personally viewed and evaluated these images (plain radiographs) as part of my medical decision making, as well as reviewing the written report by the radiologist.  ED MD interpretation: No acute abnormalities on chest  x-ray  Official radiology report(s): DG Chest 2 View  Result Date: 03/28/2019 CLINICAL DATA:  Chest pain, shortness of breath EXAM: CHEST - 2 VIEW COMPARISON:  04/12/2016 FINDINGS: The heart size and mediastinal contours are within normal limits. Both lungs are clear. The visualized skeletal structures are unremarkable. IMPRESSION: Normal study. Electronically Signed   By: Rolm Baptise M.D.   On: 03/28/2019 22:24    ____________________________________________   PROCEDURES   Procedure(s) performed (including Critical Care):  Procedures   ____________________________________________   INITIAL IMPRESSION / MDM / [place] / ED COURSE  As part of my medical decision making, I reviewed the following data within the [place] notes reviewed and incorporated, Labs reviewed , EKG interpreted , Old chart reviewed, Radiograph reviewed  and Notes from prior ED visits   Differential diagnosis includes, but is not limited to, COVID-19, vasovagal episode, PE, community-acquired pneumonia, metabolic or electrolyte abnormality, acute infection/sepsis.  The only abnormality on  the patient's lab work so far is a lactic acid of 2.3.  She is getting a liter of fluids and we will reassess.  She does not meet sepsis criteria, she is PERC negative, and she is low risk for ACS based on HEAR score.  She is comfortable with the plan for discharge and outpatient follow-up if her repeat lactic acid has improved and she is still feeling okay, even if not back at baseline.  No abnormalities on x-ray nor EKG.  She declines additional COVID-19 testing.      Clinical Course as of Mar 28 541  Fri Mar 29, 2019  0055 Lactic Acid, Venous(!!): 2.3 [CF]  0241 Lactic acid completely resolved, will discharge as per previous plan.  Lactic Acid, Venous: 1.0 [CF]    Clinical Course User Index [CF] Hinda Kehr, MD     ____________________________________________  FINAL CLINICAL  IMPRESSION(S) / ED DIAGNOSES  Final diagnoses:  Malaise and fatigue  Dental infection  Chest pressure     MEDICATIONS GIVEN DURING THIS VISIT:  Medications  sodium chloride 0.9 % bolus 1,000 mL (0 mLs Intravenous Stopped 03/29/19 0158)     ED Discharge Orders    None      *Please note:  Daisy Ross was evaluated in Emergency Department on 03/29/2019 for the symptoms described in the history of present illness. She was evaluated in the context of the global COVID-19 pandemic, which necessitated consideration that the patient might be at risk for infection with the SARS-CoV-2 virus that causes COVID-19. Institutional protocols and algorithms that pertain to the evaluation of patients at risk for COVID-19 are in a state of rapid change based on information released by regulatory bodies including the CDC and federal and state organizations. These policies and algorithms were followed during the patient's care in the ED.  Some ED evaluations and interventions may be delayed as a result of limited staffing during the pandemic.*  Note:  This document was prepared using Dragon voice recognition software and may include unintentional dictation errors.   Hinda Kehr, MD 03/29/19 [phone]

## 2019-03-29 NOTE — ED Notes (Signed)
This RN will draw 2nd lactic acid when 1L NS Bolus is complete, per MD Karma Greaser order.

## 2019-03-29 NOTE — ED Notes (Signed)
MD Karma Greaser states that 2nd troponin draw isn't necessary.

## 2019-03-29 NOTE — Discharge Instructions (Addendum)
Your work-up was generally reassuring tonight.  Please continue taking the antibiotics you were prescribed and follow-up as soon as possible with a dentist (you were given a dental resource guide if you need some contact information).  Follow-up with your regular doctor regarding the other symptoms.  Please drink plenty of fluids because you might have been a little bit dehydrated which contributed to or caused your symptoms.  Return to the emergency department with new or worsening symptoms that concern you.

## 2019-05-21 ENCOUNTER — Other Ambulatory Visit: Payer: Self-pay

## 2019-05-21 ENCOUNTER — Emergency Department: Payer: BC Managed Care – PPO

## 2019-05-21 ENCOUNTER — Emergency Department
Admission: EM | Admit: 2019-05-21 | Discharge: 2019-05-21 | Disposition: A | Discharge status: Home / Self Care | Payer: BC Managed Care – PPO | Attending: Emergency Medicine | Admitting: Emergency Medicine

## 2019-05-21 ENCOUNTER — Encounter: Payer: Self-pay | Admitting: Emergency Medicine

## 2019-05-21 DIAGNOSIS — K2900 Acute gastritis without bleeding: Secondary | ICD-10-CM | POA: Diagnosis not present

## 2019-05-21 DIAGNOSIS — R0602 Shortness of breath: Secondary | ICD-10-CM | POA: Diagnosis not present

## 2019-05-21 DIAGNOSIS — R1013 Epigastric pain: Secondary | ICD-10-CM | POA: Diagnosis present

## 2019-05-21 DIAGNOSIS — Z79899 Other long term (current) drug therapy: Secondary | ICD-10-CM | POA: Insufficient documentation

## 2019-05-21 DIAGNOSIS — R0789 Other chest pain: Secondary | ICD-10-CM | POA: Diagnosis not present

## 2019-05-21 LAB — HEPATIC FUNCTION PANEL
ALT: 30 U/L (ref 0–44)
AST: 30 U/L (ref 15–41)
Albumin: 3.6 g/dL (ref 3.5–5.0)
Alkaline Phosphatase: 55 U/L (ref 38–126)
Bilirubin, Direct: <0.1 mg/dL (ref 0.0–0.2)
Total Bilirubin: 0.4 mg/dL (ref 0.3–1.2)
Total Protein: 7.4 g/dL (ref 6.5–8.1)

## 2019-05-21 LAB — CBC
HCT: 38.2 % (ref 36.0–46.0)
Hemoglobin: 12.4 g/dL (ref 12.0–15.0)
MCH: 28.8 pg (ref 26.0–34.0)
MCHC: 32.5 g/dL (ref 30.0–36.0)
MCV: 88.8 fL (ref 80.0–100.0)
Platelets: 281 10*3/uL (ref 150–400)
RBC: 4.3 MIL/uL (ref 3.87–5.11)
RDW: 12.9 % (ref 11.5–15.5)
WBC: 7.8 10*3/uL (ref 4.0–10.5)
nRBC: 0 % (ref 0.0–0.2)

## 2019-05-21 LAB — URINALYSIS, COMPLETE (UACMP) WITH MICROSCOPIC
Bacteria, UA: NONE SEEN
Bilirubin Urine: NEGATIVE
Glucose, UA: NEGATIVE mg/dL
Hgb urine dipstick: NEGATIVE
Ketones, ur: NEGATIVE mg/dL
Leukocytes,Ua: NEGATIVE
Nitrite: NEGATIVE
Protein, ur: NEGATIVE mg/dL
Specific Gravity, Urine: 1.01 (ref 1.005–1.030)
Squamous Epithelial / HPF: NONE SEEN (ref 0–5)
pH: 6 (ref 5.0–8.0)

## 2019-05-21 LAB — POCT PREGNANCY, URINE: Preg Test, Ur: NEGATIVE

## 2019-05-21 LAB — BASIC METABOLIC PANEL
Anion gap: 6 (ref 5–15)
BUN: 11 mg/dL (ref 6–20)
CO2: 25 mmol/L (ref 22–32)
Calcium: 8.7 mg/dL — ABNORMAL LOW (ref 8.9–10.3)
Chloride: 105 mmol/L (ref 98–111)
Creatinine, Ser: 0.59 mg/dL (ref 0.44–1.00)
GFR calc Af Amer: >60 mL/min (ref >60)
GFR calc non Af Amer: >60 mL/min (ref >60)
Glucose, Bld: 116 mg/dL — ABNORMAL HIGH (ref 70–99)
Potassium: 3.8 mmol/L (ref 3.5–5.1)
Sodium: 136 mmol/L (ref 135–145)

## 2019-05-21 LAB — TROPONIN I (HIGH SENSITIVITY): Troponin I (High Sensitivity): 2 ng/L (ref <18)

## 2019-05-21 LAB — LIPASE, BLOOD: Lipase: 25 U/L (ref 11–51)

## 2019-05-21 MED ORDER — LIDOCAINE VISCOUS HCL 2 % MT SOLN
15.0000 mL | Freq: Once | OROMUCOSAL | Status: AC
  Administered 2019-05-21: 14:00:00 15 mL via ORAL
  Filled 2019-05-21: qty 15

## 2019-05-21 MED ORDER — IOHEXOL 300 MG/ML  SOLN
125.0000 mL | Freq: Once | INTRAMUSCULAR | Status: DC | PRN
  Filled 2019-05-21: qty 125

## 2019-05-21 MED ORDER — IOHEXOL 300 MG/ML  SOLN
100.0000 mL | Freq: Once | INTRAMUSCULAR | Status: AC | PRN
  Administered 2019-05-21: 16:00:00 100 mL via INTRAVENOUS
  Filled 2019-05-21: qty 100

## 2019-05-21 MED ORDER — ALUM & MAG HYDROXIDE-SIMETH 200-200-20 MG/5ML PO SUSP
15.0000 mL | Freq: Once | ORAL | Status: AC
  Administered 2019-05-21: 14:00:00 15 mL via ORAL
  Filled 2019-05-21: qty 30

## 2019-05-21 MED ORDER — SUCRALFATE 1 GM/10ML PO SUSP: 1.0000 g | Freq: Four times a day (QID) | ORAL | 1 refills | Status: DC

## 2019-05-21 MED ORDER — MORPHINE SULFATE (PF) 4 MG/ML IV SOLN
4.0000 mg | Freq: Once | INTRAVENOUS | Status: AC
  Administered 2019-05-21: 16:00:00 4 mg via INTRAVENOUS
  Filled 2019-05-21: qty 1

## 2019-05-21 MED ORDER — KETOROLAC TROMETHAMINE 30 MG/ML IJ SOLN
15.0000 mg | Freq: Once | INTRAMUSCULAR | Status: AC
  Administered 2019-05-21: 15 mg via INTRAVENOUS
  Filled 2019-05-21: qty 1

## 2019-05-21 MED ORDER — OMEPRAZOLE 20 MG PO CPDR: 20.0000 mg | DELAYED_RELEASE_CAPSULE | Freq: Every day | ORAL | 1 refills | Status: DC

## 2019-05-21 NOTE — ED Triage Notes (Signed)
Patient reports chest pain with shortness of breath to midsternum x2 days.

## 2019-05-21 NOTE — ED Provider Notes (Signed)
[facility] Emergency Department Provider Note   ____________________________________________   First MD Initiated Contact with Patient 05/21/19 1311     (approximate)  I have reviewed the triage vital signs and the nursing notes.   HISTORY  Chief Complaint Chest Pain    HPI Daisy Ross is a 44 y.o. female with possible history of bradycardia who presents to the ED complaining of chest pain.  Patient states that she has been having 2 days of constant pain in the area of her lower sternum and epigastrium.  Pain is described as sharp and stabbing, not exacerbated or alleviated by anything.  She has not had any nausea or vomiting and denies any changes in her bowel movements.  Pain does not seem to be associated with eating or anything else that she has done.  She denies any fevers or cough, but does state that the pain can make her feel short of breath at times.  She denies any history of similar symptoms in the past, does not drink alcohol or take over-the-counter pain medication.        Past Medical History:  Diagnosis Date  . History of bradycardia   . HSV-2 (herpes simplex virus 2) infection     Patient Active Problem List   Diagnosis Date Noted  . Cellulitis of left leg 10/30/2018  . Toxic effect of fiberglass 05/22/2017  . Rosacea 03/22/2017  . Elevated LFTs 08/27/2013  . Prediabetes 08/27/2013  . Preoperative cardiovascular examination 08/15/2012  . Atypical chest pain 10/23/2010  . Dizziness 10/23/2010  . Bradycardia 10/23/2010  . Fatigue 10/23/2010    Past Surgical History:  Procedure Laterality Date  . KNEE SURGERY      Prior to Admission medications   Medication Sig Start Date End Date Taking? Authorizing Provider  fluticasone (FLONASE) 50 MCG/ACT nasal spray Place 1 spray into both nostrils daily as needed.  04/25/17   [provider]  omeprazole (PRILOSEC) 20 MG capsule Take 1 capsule (20 mg total) by mouth daily.  05/21/19 05/20/20  Blake Divine, MD  sucralfate (CARAFATE) 1 GM/10ML suspension Take 10 mLs (1 g total) by mouth 4 (four) times daily. 05/21/19 05/20/20  Blake Divine, MD  valACYclovir (VALTREX) 500 MG tablet TAKE 1 TABLET BY MOUTH EVERY DAY 07/20/18   Schuman, Stefanie Libel, MD    Allergies Tramadol, Ultram [tramadol hcl], and Wasp venom  Family History  Problem Relation Age of Onset  . Heart disease Mother   . Multiple myeloma Maternal Aunt   . Breast cancer Maternal Grandmother   . Lung cancer Paternal Grandmother   . Stomach cancer Paternal Grandmother     Social History Social History   Tobacco Use  . Smoking status: Never Smoker  . Smokeless tobacco: Never Used  Substance Use Topics  . Alcohol use: Yes    Alcohol/week: 1.0 standard drinks    Types: 1 Standard drinks or equivalent per week  . Drug use: No    Review of Systems  Constitutional: No fever/chills Eyes: No visual changes. ENT: No sore throat. Cardiovascular: Positive for chest pain. Respiratory: Positive for shortness of breath. Gastrointestinal: No abdominal pain.  No nausea, no vomiting.  No diarrhea.  No constipation. Genitourinary: Negative for dysuria. Musculoskeletal: Negative for back pain. Skin: Negative for rash. Neurological: Negative for headaches, focal weakness or numbness.  ____________________________________________   PHYSICAL EXAM:  VITAL SIGNS: ED Triage Vitals  Enc Vitals Group     BP 05/21/19 1113 127/76  Pulse Rate 05/21/19 1113 71     Resp 05/21/19 1113 18     Temp 05/21/19 1113 98.8 F (37.1 C)     Temp Source 05/21/19 1113 Oral     SpO2 05/21/19 1113 99 %     Weight 05/21/19 1114 190 lb (86.2 kg)     Height 05/21/19 1114 '5\' 8"'  (1.727 m)     Head Circumference --      Peak Flow --      Pain Score 05/21/19 1117 7     Pain Loc --      Pain Edu? --      Excl. in [place]? --     Constitutional: Alert and oriented. Eyes: Conjunctivae are normal. Head: Atraumatic. Nose:  No congestion/rhinnorhea. Mouth/Throat: Mucous membranes are moist. Neck: Normal ROM Cardiovascular: Normal rate, regular rhythm. Grossly normal heart sounds. Respiratory: Normal respiratory effort.  No retractions. Lungs CTAB.  Low anterior chest wall tender to palpation, reproducing pain. Gastrointestinal: Soft and tender to palpation in the epigastrium with no rebound or guarding. No distention. Genitourinary: deferred Musculoskeletal: No lower extremity tenderness nor edema. Neurologic:  Normal speech and language. No gross focal neurologic deficits are appreciated. Skin:  Skin is warm, dry and intact. No rash noted. Psychiatric: Mood and affect are normal. Speech and behavior are normal.  ____________________________________________   LABS (all labs ordered are listed, but only abnormal results are displayed)  Labs Reviewed  BASIC METABOLIC PANEL - Abnormal; Notable for the following components:      Result Value   Glucose, Bld 116 (*)    Calcium 8.7 (*)    All other components within normal limits  URINALYSIS, COMPLETE (UACMP) WITH MICROSCOPIC - Abnormal; Notable for the following components:   Color, Urine YELLOW (*)    APPearance CLEAR (*)    All other components within normal limits  CBC  HEPATIC FUNCTION PANEL  LIPASE, BLOOD  POC URINE PREG, ED  POCT PREGNANCY, URINE  TROPONIN I (HIGH SENSITIVITY)   ____________________________________________  EKG  ED ECG REPORT I, Blake Divine, the attending physician, personally viewed and interpreted this ECG.   Date: 05/21/2019  EKG Time: 11:16  Rate: 64  Rhythm: normal sinus rhythm  Axis: Normal  Intervals:none  ST&T Change: None  PROCEDURES  Procedure(s) performed (including Critical Care):  Procedures   ____________________________________________   INITIAL IMPRESSION / ASSESSMENT AND PLAN / ED COURSE       44 year old female with no significant past medical history presents to the ED complaining of  lower anterior chest wall as well as epigastric pain constantly for the past 2 days.  EKG shows no acute ischemic changes and troponin negative, I have a low suspicion for cardiac etiology of her symptoms.  Chest x-ray is also negative for acute process.  Remainder of lab work is unremarkable thus far, will add on LFTs and lipase given epigastric discomfort.  Symptoms seem most consistent with gastritis/GERD, will trial GI cocktail while additional labs pending.  I doubt PE as she is PERC negative, does not take OCPs or any other hormonal medications.  LFTs and lipase are unremarkable, pregnancy testing negative and UA unremarkable.  Patient continues to complain of significant epigastric discomfort despite Toradol.  We will give dose of IV morphine and further assess with CT scan.  CT scan is negative for acute process, does show uterine fibroids but I doubt these are contributing to her pain given epigastric location of discomfort.  At this point, I suspect gastritis versus  PUD and we will start patient on PPI as well as Carafate.  She was provided with referral to GI and also counseled to follow-up with her PCP.  Patient agrees with plan.      ____________________________________________   FINAL CLINICAL IMPRESSION(S) / ED DIAGNOSES  Final diagnoses:  Epigastric pain  Acute gastritis without hemorrhage, unspecified gastritis type     ED Discharge Orders         Ordered    omeprazole (PRILOSEC) 20 MG capsule  Daily     05/21/19 1652    sucralfate (CARAFATE) 1 GM/10ML suspension  4 times daily     05/21/19 1652           Note:  This document was prepared using Dragon voice recognition software and may include unintentional dictation errors.   Blake Divine, MD 05/21/19 [phone]

## 2019-06-05 ENCOUNTER — Other Ambulatory Visit: Payer: Self-pay

## 2019-06-05 ENCOUNTER — Ambulatory Visit (INDEPENDENT_AMBULATORY_CARE_PROVIDER_SITE_OTHER): Payer: BC Managed Care – PPO | Admitting: Gastroenterology

## 2019-06-05 VITALS — BP 118/80 | HR 79 | Temp 98.2°F | Ht 67.0 in | Wt 192.0 lb

## 2019-06-05 DIAGNOSIS — R1013 Epigastric pain: Secondary | ICD-10-CM

## 2019-06-05 DIAGNOSIS — Z86010 Personal history of colonic polyps: Secondary | ICD-10-CM

## 2019-06-05 MED ORDER — OMEPRAZOLE 40 MG PO CPDR: 40.0000 mg | DELAYED_RELEASE_CAPSULE | Freq: Every day | ORAL | 0 refills | Status: DC

## 2019-06-05 NOTE — Progress Notes (Signed)
Jonathon Bellows MD, MRCP(U.K) [address]  [place]  [place], [place] [address]  Main: [phone]  Fax: [phone]   Gastroenterology Consultation  Referring Provider:     [place] Primary Care Physician:  [place] Primary Gastroenterologist:  Dr. Jonathon Bellows  Reason for Consultation:     Chest pain         HPI:   Daisy Ross is a 44 y.o. y/o female presented to the emergency room with chest pain on 05/21/2019.  She underwent a CT scan of the abdomen and pelvis with contrast which showed no acute findings.  Small umbilical hernia containing fat was noted an enlarged uterus with multiple fibroids was also noted.  Lipase normal.  Hepatic function normal.Pregnancy test was negative.  Urine analysis was troponin was negative.  Hemoglobin was 12.4 g.  She was discharged to follow-up with gastroenterology as an outpatient.   Abdominal pain: Onset: Early April 2021 Site : Epigastric Radiation: To the back and sometimes right upper quadrant Severity : Severe enough to go to the ER subsequently less intense Petra Kuba of pain: Sharp like a knife Aggravating factors: Eating a full meal usually if it fatty and occurs an hour after eating Relieving factors : No clear relieving factors Weight loss: No NSAID use: No PPI use : No Gall bladder surgery: No Frequency of bowel movements: Daily soft Change in bowel movements: No Relief with bowel movements: No Gas/Bloating/Abdominal distension: No  Personal history of colon polyps was due for colonoscopy but did not get it done due to issues with Covid testing Past Medical History:  Diagnosis Date  . History of bradycardia   . HSV-2 (herpes simplex virus 2) infection     Past Surgical History:  Procedure Laterality Date  . KNEE SURGERY      Prior to Admission medications   Medication Sig Start Date End Date Taking? Authorizing Provider  fluticasone (FLONASE) 50 MCG/ACT nasal spray Place 1 spray into both  nostrils daily as needed.  04/25/17   [provider]  omeprazole (PRILOSEC) 20 MG capsule Take 1 capsule (20 mg total) by mouth daily. 05/21/19 05/20/20  Blake Divine, MD  sucralfate (CARAFATE) 1 GM/10ML suspension Take 10 mLs (1 g total) by mouth 4 (four) times daily. 05/21/19 05/20/20  Blake Divine, MD  valACYclovir (VALTREX) 500 MG tablet TAKE 1 TABLET BY MOUTH EVERY DAY 07/20/18   Schuman, Stefanie Libel, MD    Family History  Problem Relation Age of Onset  . Heart disease Mother   . Multiple myeloma Maternal Aunt   . Breast cancer Maternal Grandmother   . Lung cancer Paternal Grandmother   . Stomach cancer Paternal Grandmother      Social History   Tobacco Use  . Smoking status: Never Smoker  . Smokeless tobacco: Never Used  Substance Use Topics  . Alcohol use: Yes    Alcohol/week: 1.0 standard drinks    Types: 1 Standard drinks or equivalent per week  . Drug use: No    Allergies as of 06/05/2019 - Review Complete 05/21/2019  Allergen Reaction Noted  . Tramadol Other (See Comments) 09/09/2013  . Ultram [tramadol hcl]  04/12/2016  . Wasp venom Swelling 01/21/2019    Review of Systems:    All systems reviewed and negative except where noted in HPI.   Physical Exam:  There were no vitals taken for this visit. No LMP recorded. Psych:  Alert and cooperative. Normal mood and affect. General:   Alert,  Well-developed, well-nourished, pleasant and cooperative in NAD Head:  Normocephalic and atraumatic. Eyes:  Sclera clear, no icterus.   Conjunctiva pink. Ears:  Normal auditory acuity. Abdomen:  Normal bowel sounds.  No bruits.  Soft, non-tender and non-distended without masses, hepatosplenomegaly or hernias noted.  No guarding or rebound tenderness.    Extremities:  No clubbing or edema.  No cyanosis. Neurologic:  Alert and oriented x3;  grossly normal neurologically. Psych:  Alert and cooperative. Normal mood and affect.  Imaging Studies: DG Chest 2 View  Result  Date: 05/21/2019 CLINICAL DATA:  Chest pain EXAM: CHEST - 2 VIEW COMPARISON:  March 28, 2019 FINDINGS: The lungs are clear. The heart size and pulmonary vascularity are normal. No adenopathy. No pneumothorax. No bone lesions. IMPRESSION: No abnormality noted. Electronically Signed   By: Lowella Grip III M.D.   On: 05/21/2019 11:34   CT Abdomen Pelvis W Contrast  Result Date: 05/21/2019 CLINICAL DATA:  Chest and epigastric pain with shortness of breath for 2 days. EXAM: CT ABDOMEN AND PELVIS WITH CONTRAST TECHNIQUE: Multidetector CT imaging of the abdomen and pelvis was performed using the standard protocol following bolus administration of intravenous contrast. CONTRAST:  1100m OMNIPAQUE IOHEXOL 300 MG/ML  SOLN COMPARISON:  Abdominopelvic CT 03/07/2012. FINDINGS: Lower chest: Clear lung bases. No significant pleural or pericardial effusion. Hepatobiliary: The liver is normal in density without suspicious focal abnormality. Probable tiny cyst anteriorly in the dome of the right hepatic lobe on image 11/2. No evidence of gallstones, gallbladder wall thickening or biliary dilatation. Pancreas: Unremarkable. No pancreatic ductal dilatation or surrounding inflammatory changes. Spleen: Normal in size without focal abnormality. Adrenals/Urinary Tract: Both adrenal glands appear normal. Tiny cyst in the posterior interpolar region of the right kidney. Both kidneys otherwise appear normal. No evidence of urinary tract calculus or hydronephrosis. The bladder appears normal. Stomach/Bowel: No evidence of bowel wall thickening, distention or surrounding inflammatory change. Probable visualization of a normal appearing appendix without surrounding inflammation. Vascular/Lymphatic: There are no enlarged abdominal or pelvic lymph nodes. Stable small external iliac and inguinal lymph nodes bilaterally. No significant vascular findings. Reproductive: Progressive heterogeneous enlargement of the uterus with multiple  partially exophytic low-density masses, most consistent with multiple fibroids. No suspicious adnexal findings. Other: Small umbilical hernia containing only fat. No ascites. Musculoskeletal: No acute or significant osseous findings. IMPRESSION: 1. No acute findings or explanation for the patient's symptoms. 2. Progressive enlargement of the uterus by multiple fibroids. 3. Small umbilical hernia containing only fat. Electronically Signed   By: WRichardean SaleM.D.   On: 05/21/2019 16:09    Assessment and Plan:   EELLSIE VIOLETTEis a 44y.o. y/o female has been referred for epigastric pain ongoing since at least 3 weeks.  The description of the pain could be related to a gastric ulcer or from gallbladder.  Plan 1.  H. pylori breath test 2.  Trial of PPI commence on Prilosec 40 mg a day 3.  HIDA scan 4.  EGD and colonoscopy to be scheduled in 4 to 6 weeks time colonoscopy to be scheduled for personal history of colon polyps.  I have discussed alternative options, risks & benefits,  which include, but are not limited to, bleeding, infection, perforation,respiratory complication & drug reaction.  The patient agrees with this plan & written consent will be obtained.     Follow up in 8 weeks   Dr KJonathon BellowsMD,MRCP(U.K)

## 2019-06-05 NOTE — Addendum Note (Signed)
Addended by: Dorethea Clan on: 06/05/2019 08:52 AM   Modules accepted: Orders

## 2019-06-07 ENCOUNTER — Encounter: Payer: Self-pay | Admitting: Gastroenterology

## 2019-06-07 LAB — H. PYLORI BREATH TEST: H pylori Breath Test: NEGATIVE

## 2019-06-10 ENCOUNTER — Telehealth: Payer: Self-pay

## 2019-06-10 NOTE — Telephone Encounter (Signed)
Patient left a voicemail wanted a call back from nurse

## 2019-06-11 NOTE — Telephone Encounter (Signed)
Returned pt's call, pt wanted to verify her upcoming appointment date.

## 2019-06-17 ENCOUNTER — Encounter
Admission: RE | Admit: 2019-06-17 | Discharge: 2019-06-17 | Disposition: A | Discharge status: Home / Self Care | Payer: BC Managed Care – PPO | Source: Ambulatory Visit | Attending: Gastroenterology | Admitting: Gastroenterology

## 2019-06-17 ENCOUNTER — Other Ambulatory Visit: Payer: Self-pay

## 2019-06-17 DIAGNOSIS — R1013 Epigastric pain: Secondary | ICD-10-CM | POA: Diagnosis present

## 2019-06-17 MED ORDER — TECHNETIUM TC 99M MEBROFENIN IV KIT
5.3500 | PACK | Freq: Once | INTRAVENOUS | Status: AC | PRN
  Administered 2019-06-17: 10:00:00 5.35 via INTRAVENOUS

## 2019-06-20 ENCOUNTER — Encounter: Payer: Self-pay | Admitting: Gastroenterology

## 2019-07-17 ENCOUNTER — Telehealth: Payer: Self-pay | Admitting: Gastroenterology

## 2019-07-17 ENCOUNTER — Other Ambulatory Visit: Payer: BC Managed Care – PPO

## 2019-07-17 NOTE — Telephone Encounter (Signed)
[facility] staff pre admission Olen Cordial and Clarene Critchley has been trying to contact patient to see if has updated insurance before procedure on 07/19/2019 and also has left several messages. Wife and son will be contacted on DPR per staff.

## 2019-07-18 ENCOUNTER — Other Ambulatory Visit: Payer: Self-pay

## 2019-07-18 ENCOUNTER — Other Ambulatory Visit
Admission: RE | Admit: 2019-07-18 | Discharge: 2019-07-18 | Disposition: A | Discharge status: Home / Self Care | Payer: BC Managed Care – PPO | Source: Ambulatory Visit | Attending: Gastroenterology | Admitting: Gastroenterology

## 2019-07-18 DIAGNOSIS — Z01812 Encounter for preprocedural laboratory examination: Secondary | ICD-10-CM | POA: Insufficient documentation

## 2019-07-18 DIAGNOSIS — Z20822 Contact with and (suspected) exposure to covid-19: Secondary | ICD-10-CM | POA: Insufficient documentation

## 2019-07-18 LAB — SARS CORONAVIRUS 2 (TAT 6-24 HRS): SARS Coronavirus 2: NEGATIVE

## 2019-07-19 ENCOUNTER — Encounter: Payer: Self-pay | Admitting: Gastroenterology

## 2019-07-19 ENCOUNTER — Ambulatory Visit: Payer: BC Managed Care – PPO | Admitting: Anesthesiology

## 2019-07-19 ENCOUNTER — Encounter
Admission: RE | Disposition: A | Discharge status: Home / Self Care | Payer: Self-pay | Source: Home / Self Care | Attending: Gastroenterology

## 2019-07-19 ENCOUNTER — Ambulatory Visit
Admission: RE | Admit: 2019-07-19 | Discharge: 2019-07-19 | Disposition: A | Discharge status: Home / Self Care | Payer: BC Managed Care – PPO | Attending: Gastroenterology | Admitting: Gastroenterology

## 2019-07-19 DIAGNOSIS — R1013 Epigastric pain: Secondary | ICD-10-CM | POA: Insufficient documentation

## 2019-07-19 DIAGNOSIS — K219 Gastro-esophageal reflux disease without esophagitis: Secondary | ICD-10-CM | POA: Insufficient documentation

## 2019-07-19 DIAGNOSIS — K2950 Unspecified chronic gastritis without bleeding: Secondary | ICD-10-CM | POA: Diagnosis not present

## 2019-07-19 DIAGNOSIS — Z79899 Other long term (current) drug therapy: Secondary | ICD-10-CM | POA: Insufficient documentation

## 2019-07-19 DIAGNOSIS — Z885 Allergy status to narcotic agent status: Secondary | ICD-10-CM | POA: Insufficient documentation

## 2019-07-19 DIAGNOSIS — D120 Benign neoplasm of cecum: Secondary | ICD-10-CM | POA: Diagnosis not present

## 2019-07-19 DIAGNOSIS — Z86010 Personal history of colonic polyps: Secondary | ICD-10-CM | POA: Diagnosis not present

## 2019-07-19 DIAGNOSIS — D126 Benign neoplasm of colon, unspecified: Secondary | ICD-10-CM

## 2019-07-19 DIAGNOSIS — Z1211 Encounter for screening for malignant neoplasm of colon: Secondary | ICD-10-CM | POA: Insufficient documentation

## 2019-07-19 DIAGNOSIS — B009 Herpesviral infection, unspecified: Secondary | ICD-10-CM | POA: Insufficient documentation

## 2019-07-19 HISTORY — PX: COLONOSCOPY WITH PROPOFOL: SHX5780

## 2019-07-19 HISTORY — DX: Gastro-esophageal reflux disease without esophagitis: K21.9

## 2019-07-19 HISTORY — PX: ESOPHAGOGASTRODUODENOSCOPY (EGD) WITH PROPOFOL: SHX5813

## 2019-07-19 LAB — POCT PREGNANCY, URINE: Preg Test, Ur: NEGATIVE

## 2019-07-19 SURGERY — COLONOSCOPY WITH PROPOFOL
Anesthesia: General

## 2019-07-19 MED ORDER — LIDOCAINE HCL (CARDIAC) PF 100 MG/5ML IV SOSY
PREFILLED_SYRINGE | INTRAVENOUS | Status: DC | PRN
  Administered 2019-07-19: 20 mg via INTRAVENOUS
  Administered 2019-07-19: 80 mg via INTRAVENOUS

## 2019-07-19 MED ORDER — PROPOFOL 10 MG/ML IV BOLUS
INTRAVENOUS | Status: DC | PRN
  Administered 2019-07-19: 20 mg via INTRAVENOUS
  Administered 2019-07-19: 80 mg via INTRAVENOUS
  Administered 2019-07-19: 20 mg via INTRAVENOUS

## 2019-07-19 MED ORDER — GLYCOPYRROLATE 0.2 MG/ML IJ SOLN
INTRAMUSCULAR | Status: DC | PRN
  Administered 2019-07-19: .2 mg via INTRAVENOUS

## 2019-07-19 MED ORDER — SODIUM CHLORIDE 0.9 % IV SOLN: INTRAVENOUS | Status: DC

## 2019-07-19 MED ORDER — DEXMEDETOMIDINE HCL 200 MCG/2ML IV SOLN
INTRAVENOUS | Status: DC | PRN
  Administered 2019-07-19: 20 ug via INTRAVENOUS

## 2019-07-19 MED ORDER — PROPOFOL 500 MG/50ML IV EMUL
INTRAVENOUS | Status: DC | PRN
  Administered 2019-07-19: 175 ug/kg/min via INTRAVENOUS

## 2019-07-19 MED ORDER — GLYCOPYRROLATE 0.2 MG/ML IJ SOLN
INTRAMUSCULAR | Status: AC
  Filled 2019-07-19: qty 1

## 2019-07-19 NOTE — Transfer of Care (Signed)
Immediate Anesthesia Transfer of Care Note  Patient: Daisy Ross  Procedure(s) Performed: COLONOSCOPY WITH PROPOFOL (N/A ) ESOPHAGOGASTRODUODENOSCOPY (EGD) WITH PROPOFOL (N/A )  Patient Location: Endoscopy Unit  Anesthesia Type:General  Level of Consciousness: drowsy  Airway & Oxygen Therapy: Patient Spontanous Breathing  Post-op Assessment: Report given to RN and Post -op Vital signs reviewed and stable  Post vital signs: Reviewed and stable  Last Vitals:  Vitals Value Taken Time  BP 83/58 07/19/19 1113  Temp    Pulse 62 07/19/19 1114  Resp 16 07/19/19 1114  SpO2 98 % 07/19/19 1114  Vitals shown include unvalidated device data.  Last Pain:  Vitals:   07/19/19 0939  TempSrc: Temporal  PainSc: 0-No pain         Complications: No apparent anesthesia complications

## 2019-07-19 NOTE — Anesthesia Postprocedure Evaluation (Signed)
Anesthesia Post Note  Patient: KEALA DRUM  Procedure(s) Performed: COLONOSCOPY WITH PROPOFOL (N/A ) ESOPHAGOGASTRODUODENOSCOPY (EGD) WITH PROPOFOL (N/A )  Patient location during evaluation: Endoscopy Anesthesia Type: General Level of consciousness: awake and alert Pain management: pain level controlled Vital Signs Assessment: post-procedure vital signs reviewed and stable Respiratory status: spontaneous breathing, nonlabored ventilation, respiratory function stable and patient connected to nasal cannula oxygen Cardiovascular status: blood pressure returned to baseline and stable Postop Assessment: no apparent nausea or vomiting Anesthetic complications: no     Last Vitals:  Vitals:   07/19/19 1140 07/19/19 1150  BP: 101/68 110/68  Pulse: (!) 54 62  Resp: 15 14  Temp:    SpO2: 100% 100%    Last Pain:  Vitals:   07/19/19 1150  TempSrc:   PainSc: 0-No pain                 Precious Haws Shaniquia Brafford

## 2019-07-19 NOTE — Op Note (Signed)
[facility] Gastroenterology Patient Name: Daisy Ross Procedure Date: 07/19/2019 10:36 AM MRN: [id number] Account #: [id number] Date of Birth: Apr 04, 1975 Admit Type: Outpatient Age: 44 Room: [facility] ENDO ROOM 4 Gender: Female Note Status: Finalized Procedure:             Upper GI endoscopy Indications:           Epigastric abdominal pain Providers:             Jonathon Bellows MD, MD Medicines:             Monitored Anesthesia Care Complications:         No immediate complications. Procedure:             Pre-Anesthesia Assessment:                        - Prior to the procedure, a History and Physical was                         performed, and patient medications, allergies and                         sensitivities were reviewed. The patient's tolerance                         of previous anesthesia was reviewed.                        - The risks and benefits of the procedure and the                         sedation options and risks were discussed with the                         patient. All questions were answered and informed                         consent was obtained.                        - ASA Grade Assessment: II - A patient with mild                         systemic disease.                        After obtaining informed consent, the endoscope was                         passed under direct vision. Throughout the procedure,                         the patient's blood pressure, pulse, and oxygen                         saturations were monitored continuously. The Endoscope                         was introduced through the mouth, and advanced to the  third part of duodenum. The upper GI endoscopy was                         accomplished with ease. The patient tolerated the                         procedure well. Findings:      The esophagus was normal.      The examined duodenum was normal.      The cardia and gastric fundus were  normal on retroflexion.      Diffuse moderate inflammation characterized by congestion (edema),       erosions and erythema was found on the greater curvature of the stomach       and in the gastric antrum. Biopsies were taken with a cold forceps for       histology. Impression:            - Normal esophagus.                        - Normal examined duodenum.                        - Gastritis. Biopsied. Recommendation:        - Await pathology results.                        - Perform a colonoscopy today. Procedure Code(s):     --- Professional ---                        [phone], Esophagogastroduodenoscopy, flexible,                         transoral; with biopsy, single or multiple Diagnosis Code(s):     --- Professional ---                        K29.70, Gastritis, unspecified, without bleeding                        R10.13, Epigastric pain CPT copyright 2019 American Medical Association. All rights reserved. The codes documented in this report are preliminary and upon coder review may  be revised to meet current compliance requirements. Jonathon Bellows, MD Jonathon Bellows MD, MD 07/19/2019 10:53:36 AM This report has been signed electronically. Number of Addenda: 0 Note Initiated On: 07/19/2019 10:36 AM Estimated Blood Loss:  Estimated blood loss: none.      [facility]

## 2019-07-19 NOTE — Anesthesia Preprocedure Evaluation (Signed)
Anesthesia Evaluation  Patient identified by MRN, date of birth, ID band Patient awake    Reviewed: Allergy & Precautions, H&P , NPO status , Patient's Chart, lab work & pertinent test results  History of Anesthesia Complications Negative for: history of anesthetic complications  Airway Mallampati: III  TM Distance: >3 FB Neck ROM: full    Dental  (+) Chipped   Pulmonary neg pulmonary ROS, neg shortness of breath,    Pulmonary exam normal        Cardiovascular Exercise Tolerance: Good (-) angina(-) Past MI and (-) DOE Normal cardiovascular exam+ dysrhythmias      Neuro/Psych negative neurological ROS  negative psych ROS   GI/Hepatic Neg liver ROS, GERD  Medicated and Controlled,  Endo/Other  negative endocrine ROS  Renal/GU negative Renal ROS  negative genitourinary   Musculoskeletal   Abdominal   Peds  Hematology negative hematology ROS (+)   Anesthesia Other Findings Past Medical History: No date: GERD (gastroesophageal reflux disease) No date: History of bradycardia No date: HSV-2 (herpes simplex virus 2) infection  Past Surgical History: No date: KNEE SURGERY  BMI    Body Mass Index: 28.89 kg/m      Reproductive/Obstetrics negative OB ROS                             Anesthesia Physical Anesthesia Plan  ASA: II  Anesthesia Plan: General   Post-op Pain Management:    Induction: Intravenous  PONV Risk Score and Plan: Propofol infusion and TIVA  Airway Management Planned: Natural Airway and Nasal Cannula  Additional Equipment:   Intra-op Plan:   Post-operative Plan:   Informed Consent: I have reviewed the patients History and Physical, chart, labs and discussed the procedure including the risks, benefits and alternatives for the proposed anesthesia with the patient or authorized representative who has indicated his/her understanding and acceptance.     Dental  Advisory Given  Plan Discussed with: Anesthesiologist, CRNA and Surgeon  Anesthesia Plan Comments: (Patient consented for risks of anesthesia including but not limited to:  - adverse reactions to medications - risk of intubation if required - damage to eyes, teeth, lips or other oral mucosa - nerve damage due to positioning  - sore throat or hoarseness - Damage to heart, brain, nerves, lungs, other parts of body or loss of life  Patient voiced understanding.)        Anesthesia Quick Evaluation

## 2019-07-19 NOTE — Op Note (Signed)
[facility] Gastroenterology Patient Name: Daisy Ross Procedure Date: 07/19/2019 10:35 AM MRN: [id number] Account #: [id number] Date of Birth: 10/26/1975 Admit Type: Outpatient Age: 44 Room: [facility] ENDO ROOM 4 Gender: Female Note Status: Finalized Procedure:             Colonoscopy Indications:           High risk colon cancer surveillance: Personal history                         of colonic polyps, Last colonoscopy: December 2015 Providers:             Jonathon Bellows MD, MD Referring MD:          [place]:             Monitored Anesthesia Care Complications:         No immediate complications. Procedure:             Pre-Anesthesia Assessment:                        - Prior to the procedure, a History and Physical was                         performed, and patient medications, allergies and                         sensitivities were reviewed. The patient's tolerance                         of previous anesthesia was reviewed.                        - The risks and benefits of the procedure and the                         sedation options and risks were discussed with the                         patient. All questions were answered and informed                         consent was obtained.                        - ASA Grade Assessment: II - A patient with mild                         systemic disease.                        After obtaining informed consent, the colonoscope was                         passed under direct vision. Throughout the procedure,                         the patient's blood pressure, pulse, and oxygen                         saturations were monitored continuously. The  Colonoscope was introduced through the anus and                         advanced to the the cecum, identified by the                         appendiceal orifice. The colonoscopy was performed                         with ease. The patient  tolerated the procedure well.                         The quality of the bowel preparation was excellent. Findings:      The perianal and digital rectal examinations were normal.      A 10 mm polyp was found in the cecum. The polyp was sessile. The polyp       was removed with a cold snare. Resection and retrieval were complete.      The exam was otherwise without abnormality on direct and retroflexion       views. Impression:            - One 10 mm polyp in the cecum, removed with a cold                         snare. Resected and retrieved.                        - The examination was otherwise normal on direct and                         retroflexion views. Recommendation:        - Discharge patient to home (with escort).                        - Resume previous diet.                        - Continue present medications.                        - Await pathology results.                        - Repeat colonoscopy for surveillance based on                         pathology results. Procedure Code(s):     --- Professional ---                        [phone], Colonoscopy, flexible; with removal of                         tumor(s), polyp(s), or other lesion(s) by snare                         technique Diagnosis Code(s):     --- Professional ---                        Z86.010, Personal history of colonic  polyps                        K63.5, Polyp of colon CPT copyright 2019 American Medical Association. All rights reserved. The codes documented in this report are preliminary and upon coder review may  be revised to meet current compliance requirements. Jonathon Bellows, MD Jonathon Bellows MD, MD 07/19/2019 11:10:42 AM This report has been signed electronically. Number of Addenda: 0 Note Initiated On: 07/19/2019 10:35 AM Scope Withdrawal Time: 0 hours 12 minutes 13 seconds  Total Procedure Duration: 0 hours 13 minutes 43 seconds  Estimated Blood Loss:  Estimated blood loss: none.      [facility]

## 2019-07-19 NOTE — H&P (Signed)
Jonathon Bellows, MD [address], [place], [place], [place], [address] [address], [place], [place], [place], [address] Phone: [phone]  Fax: [phone]  Primary Care Physician:  [place]   Pre-Procedure History & Physical: HPI:  Daisy Ross is a 44 y.o. female is here for an endoscopy and colonoscopy    Past Medical History:  Diagnosis Date  . GERD (gastroesophageal reflux disease)   . History of bradycardia   . HSV-2 (herpes simplex virus 2) infection     Past Surgical History:  Procedure Laterality Date  . KNEE SURGERY      Prior to Admission medications   Medication Sig Start Date End Date Taking? Authorizing Provider  fluticasone (FLONASE) 50 MCG/ACT nasal spray Place 1 spray into both nostrils daily as needed.  04/25/17   [provider]  omeprazole (PRILOSEC) 40 MG capsule Take 1 capsule (40 mg total) by mouth daily. 06/05/19   Jonathon Bellows, MD  sucralfate (CARAFATE) 1 GM/10ML suspension Take 10 mLs (1 g total) by mouth 4 (four) times daily. Patient not taking: Reported on 06/05/2019 05/21/19 05/20/20  Blake Divine, MD  valACYclovir (VALTREX) 500 MG tablet TAKE 1 TABLET BY MOUTH EVERY DAY 07/20/18   Homero Fellers, MD    Allergies as of 06/05/2019 - Review Complete 06/05/2019  Allergen Reaction Noted  . Tramadol Other (See Comments) 09/09/2013  . Ultram [tramadol hcl]  04/12/2016  . Wasp venom Swelling 01/21/2019    Family History  Problem Relation Age of Onset  . Heart disease Mother   . Multiple myeloma Maternal Aunt   . Breast cancer Maternal Grandmother   . Lung cancer Paternal Grandmother   . Stomach cancer Paternal Grandmother     Social History   Socioeconomic History  . Marital status: Single    Spouse name: Not on file  . Number of children: 2  . Years of education: Not on file  . Highest education level: Not on file  Occupational History  . Not on file  Tobacco Use  . Smoking status: Never Smoker  .  Smokeless tobacco: Never Used  Substance and Sexual Activity  . Alcohol use: Yes    Alcohol/week: 1.0 standard drinks    Types: 1 Standard drinks or equivalent per week  . Drug use: No  . Sexual activity: Not Currently    Partners: Female    Birth control/protection: None  Other Topics Concern  . Not on file  Social History Narrative  . Not on file   Social Determinants of Health   Financial Resource Strain:   . Difficulty of Paying Living Expenses:   Food Insecurity:   . Worried About Charity fundraiser in the Last Year:   . Arboriculturist in the Last Year:   Transportation Needs:   . Film/video editor (Medical):   Marland Kitchen Lack of Transportation (Non-Medical):   Physical Activity:   . Days of Exercise per Week:   . Minutes of Exercise per Session:   Stress:   . Feeling of Stress :   Social Connections:   . Frequency of Communication with Friends and Family:   . Frequency of Social Gatherings with Friends and Family:   . Attends Religious Services:   . Active Member of Clubs or Organizations:   . Attends Archivist Meetings:   Marland Kitchen Marital Status:   Intimate Partner Violence:   . Fear of Current or Ex-Partner:   . Emotionally Abused:   .  Physically Abused:   . Sexually Abused:     Review of Systems: See HPI, otherwise negative ROS  Physical Exam: BP 124/88   Pulse 66   Temp 97.8 F (36.6 C) (Temporal)   Resp 16   Ht '5\' 8"'  (1.727 m)   Wt 86.2 kg   SpO2 100%   BMI 28.89 kg/m  General:   Alert,  pleasant and cooperative in NAD Head:  Normocephalic and atraumatic. Neck:  Supple; no masses or thyromegaly. Lungs:  Clear throughout to auscultation, normal respiratory effort.    Heart:  +S1, +S2, Regular rate and rhythm, No edema. Abdomen:  Soft, nontender and nondistended. Normal bowel sounds, without guarding, and without rebound.   Neurologic:  Alert and  oriented x4;  grossly normal neurologically.  Impression/Plan: Daisy Ross is here for an  endoscopy and colonoscopy  to be performed for  evaluation of abdominal pain and personal history of colon polyps    Risks, benefits, limitations, and alternatives regarding endoscopy have been reviewed with the patient.  Questions have been answered.  All parties agreeable.   Jonathon Bellows, MD  07/19/2019, 10:34 AM

## 2019-07-22 ENCOUNTER — Encounter: Payer: Self-pay | Admitting: *Deleted

## 2019-07-22 LAB — SURGICAL PATHOLOGY

## 2019-07-31 ENCOUNTER — Ambulatory Visit: Payer: BC Managed Care – PPO | Admitting: Gastroenterology

## 2019-07-31 NOTE — Progress Notes (Deleted)
Jonathon Bellows MD, MRCP(U.K) [address]  [place]  [place], [place] [address]  Main: [phone]  Fax: [phone]   Primary Care Physician: [place]  Primary Gastroenterologist:  Dr. Jonathon Bellows   Follow-up for abdominal pain   HPI: Daisy Ross is a 44 y.o. female    Summary of history :  Initially referred and seen in April 2021 for chest pain.  Shepresented to the emergency room with chest pain on 05/21/2019.  She underwent a CT scan of the abdomen and pelvis with contrast which showed no acute findings.  Small umbilical hernia containing fat was noted an enlarged uterus with multiple fibroids was also noted.  Lipase normal.  Hepatic function normal.Pregnancy test was negative.  Urine analysis was troponin was negative.  Hemoglobin was 12.4 g.  She was discharged to follow-up with gastroenterology as an outpatient.  The abdominal pain began in April 2021, epigastric, radiated to the back and right upper quadrant at times.  Severe enough to go to the ER.  Sharp like a knife.  Worse after a full meal and occurred an hour after eating.  No clear relieving factors.  Personal history of colon polyps.  Troponin  Interval history April 2021-07/31/2019  06/05/2019: H. pylori breath test: Negative 06/17/2019: HIDA scan: Ejection fraction 82%.  No others issues 07/19/2019: EGD: Gastritis noted at the antrum.  Biopsied.  Pathology demonstrated gastritis with reactive hyperplasia and focal intestinal metaplasia.  Colonoscopy demonstrated 10 mm polyp in the cecum that was sessile and resected with a cold snare which was a sessile serrated adenoma.   Current Outpatient Medications  Medication Sig Dispense Refill  . fluticasone (FLONASE) 50 MCG/ACT nasal spray Place 1 spray into both nostrils daily as needed.     Marland Kitchen omeprazole (PRILOSEC) 40 MG capsule Take 1 capsule (40 mg total) by mouth daily. 90 capsule 0  . sucralfate (CARAFATE) 1 GM/10ML suspension Take 10 mLs (1 g total)  by mouth 4 (four) times daily. (Patient not taking: Reported on 06/05/2019) 420 mL 1  . valACYclovir (VALTREX) 500 MG tablet TAKE 1 TABLET BY MOUTH EVERY DAY 90 tablet 3   No current facility-administered medications for this visit.    Allergies as of 07/31/2019 - Review Complete 07/19/2019  Allergen Reaction Noted  . Tramadol Other (See Comments) 09/09/2013  . Ultram [tramadol hcl]  04/12/2016  . Wasp venom Swelling 01/21/2019    ROS:  General: Negative for anorexia, weight loss, fever, chills, fatigue, weakness. ENT: Negative for hoarseness, difficulty swallowing , nasal congestion. CV: Negative for chest pain, angina, palpitations, dyspnea on exertion, peripheral edema.  Respiratory: Negative for dyspnea at rest, dyspnea on exertion, cough, sputum, wheezing.  GI: See history of present illness. GU:  Negative for dysuria, hematuria, urinary incontinence, urinary frequency, nocturnal urination.  Endo: Negative for unusual weight change.    Physical Examination:   There were no vitals taken for this visit.  General: Well-nourished, well-developed in no acute distress.  Eyes: No icterus. Conjunctivae pink. Mouth: Oropharyngeal mucosa moist and pink , no lesions erythema or exudate. Lungs: Clear to auscultation bilaterally. Non-labored. Heart: Regular rate and rhythm, no murmurs rubs or gallops.  Abdomen: Bowel sounds are normal, nontender, nondistended, no hepatosplenomegaly or masses, no abdominal bruits or hernia , no rebound or guarding.   Extremities: No lower extremity edema. No clubbing or deformities. Neuro: Alert and oriented x 3.  Grossly intact. Skin: Warm and dry, no jaundice.   Psych: Alert and cooperative, normal  mood and affect.   Imaging Studies: No results found.  Assessment and Plan:   Daisy Ross is a 44 y.o. y/o female here to follow-up for epigastric pain   The description of the pain could be related to a gastric ulcer or from  gallbladder.  Plan 1.  Gastric intestinal metaplasia: Repeat EGD in 6 months for gastric mapping 2.    Continue on Prilosec 40 mg a day  Dr Jonathon Bellows  MD,MRCP [facility]) Follow up in ***

## 2019-08-14 ENCOUNTER — Telehealth: Payer: Self-pay | Admitting: Gastroenterology

## 2019-08-14 NOTE — Telephone Encounter (Signed)
I called patient & l/m to schedule an appointment with DR Vicente Males. Daisy Ross send this message Pt left vm requesting to reschedule her follow up appt/mth

## 2019-09-02 ENCOUNTER — Other Ambulatory Visit: Payer: Self-pay | Admitting: Gastroenterology

## 2020-05-01 DIAGNOSIS — M1711 Unilateral primary osteoarthritis, right knee: Secondary | ICD-10-CM | POA: Insufficient documentation

## 2022-06-02 LAB — HM DIABETES FOOT EXAM

## 2022-06-02 LAB — HEMOGLOBIN A1C: Hemoglobin A1C: 7.3

## 2022-06-08 ENCOUNTER — Telehealth: Payer: Self-pay

## 2022-06-08 NOTE — Telephone Encounter (Signed)
Copied from CRM [id number]. Topic: Appointment Scheduling - New Patient >> Jun 08, 2022  2:08 PM [name] wrote: New patient has been scheduled for your office. Provider: Aura Dials  Date of Appointment: December 17 .Marland Kitchen... Pt. Daisy Ross (established patient ) wife of Gianina is asking if you can get her in sooner.  Route to department's PEC pool.

## 2022-06-09 NOTE — Telephone Encounter (Signed)
Called and scheduled appointment for 06/29/2022 @ 1:00 pm.

## 2022-06-26 DIAGNOSIS — D509 Iron deficiency anemia, unspecified: Secondary | ICD-10-CM | POA: Insufficient documentation

## 2022-06-29 ENCOUNTER — Ambulatory Visit: Payer: Commercial Managed Care - PPO | Admitting: Nurse Practitioner

## 2022-07-31 NOTE — Patient Instructions (Signed)
Diabetes Mellitus Basics  Diabetes mellitus, or diabetes, is a long-term (chronic) disease. It occurs when the body does not properly use sugar (glucose) that is released from food after you eat. Diabetes mellitus may be caused by one or both of these problems: Your pancreas does not make enough of a hormone called insulin. Your body does not react in a normal way to the insulin that it makes. Insulin lets glucose enter cells in your body. This gives you energy. If you have diabetes, glucose cannot get into cells. This causes high blood glucose (hyperglycemia). How to treat and manage diabetes You may need to take insulin or other diabetes medicines daily to keep your glucose in balance. If you are prescribed insulin, you will learn how to give yourself insulin by injection. You may need to adjust the amount of insulin you take based on the foods that you eat. You will need to check your blood glucose levels using a glucose monitor as told by your health care provider. The readings can help determine if you have low or high blood glucose. Generally, you should have these blood glucose levels: Before meals (preprandial): 80-130 mg/dL (4.4-7.2 mmol/L). After meals (postprandial): below 180 mg/dL (10 mmol/L). Hemoglobin A1c (HbA1c) level: less than 7%. Your health care provider will set treatment goals for you. Keep all follow-up visits. This is important. Follow these instructions at home: Diabetes medicines Take your diabetes medicines every day as told by your health care provider. List your diabetes medicines here: Name of medicine: ______________________________ Amount (dose): _______________ Time (a.m./p.m.): _______________ Notes: ___________________________________ Name of medicine: ______________________________ Amount (dose): _______________ Time (a.m./p.m.): _______________ Notes: ___________________________________ Name of medicine: ______________________________ Amount (dose):  _______________ Time (a.m./p.m.): _______________ Notes: ___________________________________ Insulin If you use insulin, list the types of insulin you use here: Insulin type: ______________________________ Amount (dose): _______________ Time (a.m./p.m.): _______________Notes: ___________________________________ Insulin type: ______________________________ Amount (dose): _______________ Time (a.m./p.m.): _______________ Notes: ___________________________________ Insulin type: ______________________________ Amount (dose): _______________ Time (a.m./p.m.): _______________ Notes: ___________________________________ Insulin type: ______________________________ Amount (dose): _______________ Time (a.m./p.m.): _______________ Notes: ___________________________________ Insulin type: ______________________________ Amount (dose): _______________ Time (a.m./p.m.): _______________ Notes: ___________________________________ Managing blood glucose  Check your blood glucose levels using a glucose monitor as told by your health care provider. Write down the times that you check your glucose levels here: Time: _______________ Notes: ___________________________________ Time: _______________ Notes: ___________________________________ Time: _______________ Notes: ___________________________________ Time: _______________ Notes: ___________________________________ Time: _______________ Notes: ___________________________________ Time: _______________ Notes: ___________________________________  Low blood glucose Low blood glucose (hypoglycemia) is when glucose is at or below 70 mg/dL (3.9 mmol/L). Symptoms may include: Feeling: Hungry. Sweaty and clammy. Irritable or easily upset. Dizzy. Sleepy. Having: A fast heartbeat. A headache. A change in your vision. Numbness around the mouth, lips, or tongue. Having trouble with: Moving (coordination). Sleeping. Treating low blood glucose To treat low blood  glucose, eat or drink something containing sugar right away. If you can think clearly and swallow safely, follow the 15:15 rule: Take 15 grams of a fast-acting carb (carbohydrate), as told by your health care provider. Some fast-acting carbs are: Glucose tablets: take 3-4 tablets. Hard candy: eat 3-5 pieces. Fruit juice: drink 4 oz (120 mL). Regular (not diet) soda: drink 4-6 oz (120-180 mL). Honey or sugar: eat 1 Tbsp (15 mL). Check your blood glucose levels 15 minutes after you take the carb. If your glucose is still at or below 70 mg/dL (3.9 mmol/L), take 15 grams of a carb again. If your glucose does not go above 70 mg/dL (3.9 mmol/L) after   3 tries, get help right away. After your glucose goes back to normal, eat a meal or a snack within 1 hour. Treating very low blood glucose If your glucose is at or below 54 mg/dL (3 mmol/L), you have very low blood glucose (severe hypoglycemia). This is an emergency. Do not wait to see if the symptoms will go away. Get medical help right away. Call your local emergency services (911 in the [place] Do not drive yourself to the hospital. Questions to ask your health care provider Should I talk with a diabetes educator? What equipment will I need to care for myself at home? What diabetes medicines do I need? When should I take them? How often do I need to check my blood glucose levels? What number can I call if I have questions? When is my follow-up visit? Where can I find a support group for people with diabetes? Where to find more information American Diabetes Association: www.diabetes.org Association of Diabetes Care and Education Specialists: www.diabeteseducator.org Contact a health care provider if: Your blood glucose is at or above 240 mg/dL (13.3 mmol/L) for 2 days in a row. You have been sick or have had a fever for 2 days or more, and you are not getting better. You have any of these problems for more than 6 hours: You cannot eat or  drink. You feel nauseous. You vomit. You have diarrhea. Get help right away if: Your blood glucose is lower than 54 mg/dL (3 mmol/L). You get confused. You have trouble thinking clearly. You have trouble breathing. These symptoms may represent a serious problem that is an emergency. Do not wait to see if the symptoms will go away. Get medical help right away. Call your local emergency services (911 in the [place] Do not drive yourself to the hospital. Summary Diabetes mellitus is a chronic disease that occurs when the body does not properly use sugar (glucose) that is released from food after you eat. Take insulin and diabetes medicines as told. Check your blood glucose every day, as often as told. Keep all follow-up visits. This is important. This information is not intended to replace advice given to you by your health care provider. Make sure you discuss any questions you have with your health care provider. Document Revised: 06/04/2019 Document Reviewed: 06/04/2019 Elsevier Patient Education  2024 [employer]  

## 2022-08-01 ENCOUNTER — Encounter: Payer: Self-pay | Admitting: Nurse Practitioner

## 2022-08-01 ENCOUNTER — Ambulatory Visit (INDEPENDENT_AMBULATORY_CARE_PROVIDER_SITE_OTHER): Payer: Commercial Managed Care - PPO | Admitting: Nurse Practitioner

## 2022-08-01 VITALS — BP 120/79 | HR 59 | Temp 98.4°F | Ht 67.99 in | Wt 193.0 lb

## 2022-08-01 DIAGNOSIS — E1165 Type 2 diabetes mellitus with hyperglycemia: Secondary | ICD-10-CM | POA: Diagnosis not present

## 2022-08-01 DIAGNOSIS — G44309 Post-traumatic headache, unspecified, not intractable: Secondary | ICD-10-CM | POA: Diagnosis not present

## 2022-08-01 DIAGNOSIS — R7989 Other specified abnormal findings of blood chemistry: Secondary | ICD-10-CM | POA: Diagnosis not present

## 2022-08-01 DIAGNOSIS — B009 Herpesviral infection, unspecified: Secondary | ICD-10-CM

## 2022-08-01 DIAGNOSIS — D508 Other iron deficiency anemias: Secondary | ICD-10-CM

## 2022-08-01 DIAGNOSIS — Z7984 Long term (current) use of oral hypoglycemic drugs: Secondary | ICD-10-CM

## 2022-08-01 DIAGNOSIS — Z7689 Persons encountering health services in other specified circumstances: Secondary | ICD-10-CM

## 2022-08-01 MED ORDER — METFORMIN HCL 500 MG PO TABS: 500.0000 mg | ORAL_TABLET | Freq: Two times a day (BID) | ORAL | 4 refills | Status: DC

## 2022-08-01 MED ORDER — SLOW IRON 160 (50 FE) MG PO TBCR: 160.0000 mg | EXTENDED_RELEASE_TABLET | Freq: Every day | ORAL | 4 refills | Status: DC

## 2022-08-01 MED ORDER — VALACYCLOVIR HCL 500 MG PO TABS: 500.0000 mg | ORAL_TABLET | Freq: Every day | ORAL | 4 refills | Status: DC

## 2022-08-01 MED ORDER — KETOROLAC TROMETHAMINE 60 MG/2ML IM SOLN
60.0000 mg | Freq: Once | INTRAMUSCULAR | Status: AC
Start: 2022-08-01 — End: 2022-08-01
  Administered 2022-08-01: 60 mg via INTRAMUSCULAR

## 2022-08-01 NOTE — Assessment & Plan Note (Signed)
Noted on past labs, mild elevation in ALT.  Plan to recheck labs next visit.

## 2022-08-01 NOTE — Progress Notes (Signed)
New Patient Office Visit  Subjective    Patient ID: Daisy Ross, female    DOB: Apr 07, 1975  Age: 47 y.o. MRN: [id number]  CC:  Chief Complaint  Patient presents with   New Patient (Initial Visit)    Here to establish care     HPI PAITYNN OLINSKI presents for new patient visit to establish care.  Introduced to Publishing rights manager role and practice setting.  All questions answered.  Discussed provider/patient relationship and expectations. Was followed by [facility], Dr. Letitia Libra.   Needs refill on Valtrex for cold sores.  HEADACHE POST CONCUSSION A 50 lbs dog ran into door and the door hit her face on left side, while she was trying to catch other dog from going outside.  No syncope or LOC.  Was seen in ER on 06/11/22 for concussion.  Continues to have vertigo dependent on position + headaches + sharp pain to left temple intermittent.  Pain increases with alcohol use, she has noticed.  Was taking D3/K vitamin and this helped her sleep and helped headaches + Magnesium helped.    Did have imaging CT head and CT orbits -- overall these were reassuring. Duration: months Onset: gradual Severity: 3-10/10 -- constantly there, sharp pain is a 10/10 Quality: varies dull and aching to sharp  Frequency: dull and aching constant, but sharp Location: left temple area Headache duration: dull and aching is constant , but sharp lasts a few minutes Radiation: no Time of day headache occurs: will stay for 3 days and go away, then will come back Alleviating factors: Tylenol and Motrin Aggravating factors: alcohol Headache status at time of visit: current headache Treatments attempted: ice, APAP, and ibuprofen, Magnesium, Vit D/K Aura: no Nausea:  yes Vomiting: no Photophobia:  no Phonophobia:  no Effect on social functioning: no Numbers of missed days of school/work each month: x 1 Confusion:  no Gait disturbance/ataxia:  no Behavioral changes: more assertive since concussion Fevers:  no    DIABETES On review of recent labs her A1c was 7.3% on 06/02/2022 with [facility].  Previous were 6.4% (September 2020) and 6.6% (February 2022).  Can go all day without eating.  Not currently taking any medications.  All the women on mother's side have diabetes, on father's side her grandfather had this.    Reports occasionally gets a warm liquid feeling to left upper quadrant, has not had in a couple weeks.   Hypoglycemic episodes:no Polydipsia/polyuria:  a little urinating more then normal Visual disturbance: no Chest pain: no Paresthesias: yes Glucose Monitoring: no  Accucheck frequency: Not Checking  Fasting glucose:  Post prandial:  Evening:  Before meals: Taking Insulin?: no  Long acting insulin:  Short acting insulin: Blood Pressure Monitoring: not checking Retinal Examination: Up to Date -- [facility] Foot Exam: Up to Date Pneumovax: unknown Influenza:  refuses Aspirin: no   ANEMIA Noted on [facility] labs 06/02/2022 with H/H 10.3/32.7 and iron level 24.  Her AST was within normal range, but ALT 43.  She was complaining of abdominal pain at the time.  Has seen GI in past, last was Dr. Tobi Bastos on 06/05/19.  Last EGD and colonoscopy were in 2021 = gastritis noted and one 10 cm polyp removed.  Did not take iron supplement after first week due to side effects, nausea with this.    Has heavier menstrual cycles -- monthly cycles, last at least one week (4-7 days at baseline).  Have been heavier for years.  Only uses pads, uses 2  pads at a time -- changes 4 times a day.  Is a G2 P2.   Anemia status: uncontrolled Etiology of anemia: suspect due to heavier cycles Duration of anemia treatment: months Compliance with treatment: poor compliance Iron supplementation side effects: yes Severity of anemia: mild Fatigue: yes Decreased exercise tolerance: no  Dyspnea on exertion: yes -- can walk and be short of breath Palpitations: occasional flutters -- saw cardiology 05/18/20 Bleeding: no Pica:  no   Outpatient Encounter Medications as of 08/01/2022  Medication Sig   ferrous sulfate (SLOW IRON) 160 (50 Fe) MG TBCR SR tablet Take 1 tablet (160 mg total) by mouth daily.   metFORMIN (GLUCOPHAGE) 500 MG tablet Take 1 tablet (500 mg total) by mouth 2 (two) times daily with a meal.   [DISCONTINUED] ferrous sulfate 325 (65 FE) MG EC tablet Take 1 tablet by mouth daily with breakfast.   [DISCONTINUED] fluticasone (FLONASE) 50 MCG/ACT nasal spray Place 1 spray into both nostrils daily as needed.    [DISCONTINUED] valACYclovir (VALTREX) 500 MG tablet TAKE 1 TABLET BY MOUTH EVERY DAY   valACYclovir (VALTREX) 500 MG tablet Take 1 tablet (500 mg total) by mouth daily.   [DISCONTINUED] Fructooligosaccharides (FOS PO) Take 1 tablet by mouth daily at 12 noon.   [DISCONTINUED] meloxicam (MOBIC) 15 MG tablet Take 1 tablet by mouth daily.   [DISCONTINUED] nabumetone (RELAFEN) 500 MG tablet Take 500 mg by mouth 2 (two) times daily.   [DISCONTINUED] omeprazole (PRILOSEC) 40 MG capsule TAKE 1 CAPSULE BY MOUTH EVERY DAY   [DISCONTINUED] ondansetron (ZOFRAN-ODT) 4 MG disintegrating tablet Take 1 tablet by mouth every 8 (eight) hours as needed.   [EXPIRED] ketorolac (TORADOL) injection 60 mg    No facility-administered encounter medications on file as of 08/01/2022.    Past Medical History:  Diagnosis Date   GERD (gastroesophageal reflux disease)    History of bradycardia    HSV-2 (herpes simplex virus 2) infection     Past Surgical History:  Procedure Laterality Date   COLONOSCOPY WITH PROPOFOL N/A 07/19/2019   Procedure: COLONOSCOPY WITH PROPOFOL;  Surgeon: Wyline Mood, MD;  Location: [facility] ENDOSCOPY;  Service: Gastroenterology;  Laterality: N/A;   ESOPHAGOGASTRODUODENOSCOPY (EGD) WITH PROPOFOL N/A 07/19/2019   Procedure: ESOPHAGOGASTRODUODENOSCOPY (EGD) WITH PROPOFOL;  Surgeon: Wyline Mood, MD;  Location: [facility] ENDOSCOPY;  Service: Gastroenterology;  Laterality: N/A;   KNEE SURGERY      Family History   Problem Relation Age of Onset   Diabetes Mother    Heart disease Mother    Thyroid disease Sister    Ulcers Sister    Multiple myeloma Maternal Aunt    Breast cancer Maternal Grandmother    Diabetes Maternal Grandmother    Heart failure Maternal Grandfather    Diabetes Maternal Grandfather    Lung cancer Paternal Grandmother    Stomach cancer Paternal Grandmother    Heart disease Paternal Grandfather    Diabetes Paternal Grandfather    Diabetes Mellitus II Paternal Grandfather    Heart Problems Paternal Grandfather     Social History   Socioeconomic History   Marital status: Single    Spouse name: Not on file   Number of children: 2   Years of education: Not on file   Highest education level: Not on file  Occupational History   Not on file  Tobacco Use   Smoking status: Never    Passive exposure: Past   Smokeless tobacco: Never  Vaping Use   Vaping Use: Never used  Substance and  Sexual Activity   Alcohol use: Yes    Alcohol/week: 1.0 standard drink of alcohol    Types: 1 Standard drinks or equivalent per week   Drug use: Not Currently    Types: Marijuana   Sexual activity: Yes    Partners: Female  Other Topics Concern   Not on file  Social History Narrative   Not on file   Social Determinants of Health   Financial Resource Strain: Low Risk  (08/01/2022)   Overall Financial Resource Strain (CARDIA)    Difficulty of Paying Living Expenses: Not hard at all  Food Insecurity: No Food Insecurity (08/01/2022)   Hunger Vital Sign    Worried About Running Out of Food in the Last Year: Never true    Ran Out of Food in the Last Year: Never true  Transportation Needs: No Transportation Needs (08/01/2022)   PRAPARE - Administrator, Civil Service (Medical): No    Lack of Transportation (Non-Medical): No  Physical Activity: Insufficiently Active (08/01/2022)   Exercise Vital Sign    Days of Exercise per Week: 2 days    Minutes of Exercise per Session: 20 min   Stress: Stress Concern Present (08/01/2022)   [employer] of Occupational Health - Occupational Stress Questionnaire    Feeling of Stress : To some extent  Social Connections: Moderately Isolated (08/01/2022)   Social Connection and Isolation Panel [NHANES]    Frequency of Communication with Friends and Family: More than three times a week    Frequency of Social Gatherings with Friends and Family: More than three times a week    Attends Religious Services: Never    Database administrator or Organizations: No    Attends Banker Meetings: Never    Marital Status: Married  Catering manager Violence: Not At Risk (08/01/2022)   Humiliation, Afraid, Rape, and Kick questionnaire    Fear of Current or Ex-Partner: No    Emotionally Abused: No    Physically Abused: No    Sexually Abused: No   ROS      Objective    BP 120/79   Pulse (!) 59   Temp 98.4 F (36.9 C) (Oral)   Ht 5' 7.99" (1.727 m)   Wt 193 lb (87.5 kg)   SpO2 98%   BMI 29.35 kg/m   Physical Exam Vitals and nursing note reviewed.  Constitutional:      General: She is awake. She is not in acute distress.    Appearance: She is well-developed and well-groomed. She is not ill-appearing or toxic-appearing.  HENT:     Head: Normocephalic.     Right Ear: Hearing and external ear normal.     Left Ear: Hearing and external ear normal.  Eyes:     General: Lids are normal.        Right eye: No discharge.        Left eye: No discharge.     Conjunctiva/sclera: Conjunctivae normal.     Pupils: Pupils are equal, round, and reactive to light.  Neck:     Thyroid: No thyromegaly.     Vascular: No carotid bruit.  Cardiovascular:     Rate and Rhythm: Regular rhythm. Bradycardia present.     Heart sounds: Normal heart sounds. No murmur heard.    No gallop.  Pulmonary:     Effort: Pulmonary effort is normal. No accessory muscle usage or respiratory distress.     Breath sounds: Normal breath sounds.  Abdominal:  General: Bowel sounds are normal. There is no distension.     Palpations: Abdomen is soft.     Tenderness: There is no abdominal tenderness.  Musculoskeletal:     Cervical back: Normal range of motion and neck supple.     Right lower leg: No edema.     Left lower leg: No edema.  Lymphadenopathy:     Cervical: No cervical adenopathy.  Skin:    General: Skin is warm and dry.  Neurological:     Mental Status: She is alert and oriented to person, place, and time.  Psychiatric:        Attention and Perception: Attention normal.        Mood and Affect: Mood normal.        Speech: Speech normal.        Behavior: Behavior normal. Behavior is cooperative.        Thought Content: Thought content normal.    Diabetic Foot Exam - Simple   Simple Foot Form Visual Inspection No deformities, no ulcerations, no other skin breakdown bilaterally: Yes Sensation Testing Intact to touch and monofilament testing bilaterally: Yes Pulse Check Posterior Tibialis and Dorsalis pulse intact bilaterally: Yes Comments    Last CBC Lab Results  Component Value Date   WBC 7.8 05/21/2019   HGB 12.4 05/21/2019   HCT 38.2 05/21/2019   MCV 88.8 05/21/2019   MCH 28.8 05/21/2019   RDW 12.9 05/21/2019   PLT 281 05/21/2019   Last metabolic panel Lab Results  Component Value Date   GLUCOSE 116 (H) 05/21/2019   NA 136 05/21/2019   K 3.8 05/21/2019   CL 105 05/21/2019   CO2 25 05/21/2019   BUN 11 05/21/2019   CREATININE 0.59 05/21/2019   GFRNONAA >60 05/21/2019   CALCIUM 8.7 (L) 05/21/2019   PROT 7.4 05/21/2019   ALBUMIN 3.6 05/21/2019   BILITOT 0.4 05/21/2019   ALKPHOS 55 05/21/2019   AST 30 05/21/2019   ALT 30 05/21/2019   ANIONGAP 6 05/21/2019   Last hemoglobin A1c Lab Results  Component Value Date   HGBA1C 6.4 (H) 10/31/2018      Assessment & Plan:   Problem List Items Addressed This Visit       Endocrine   Type 2 diabetes mellitus (HCC) - Primary    Ongoing since initial  diagnosis on 06/02/22 with A1c 7.3% -- trending up since checks 2020 and 2022.  Discussed with patient.  Strong family history in females of this.  Educated on treatment options, will start Metformin 500 MG BID and adjust as needed.  Educated on this medication use and side effects, to reach out to provider if any ADR.  Plan to send in glucometer and supplies next visit, then will have check BS a few times a week.  Recommend heavy focus on diet changes at home.  Check A1c, urine ALB, CMP, Lipid in July when due. - No current statin or ACE/ARB, will further discuss future visits - Recommend eye exam [facility]) and send for recent recommends. - Foot exam due - Will discuss PCV20 future visit.      Relevant Medications   metFORMIN (GLUCOPHAGE) 500 MG tablet     Other   Elevated LFTs    Noted on past labs, mild elevation in ALT.  Plan to recheck labs next visit.      Herpes simplex virus infection    Oral cold sores on occasion.  Refills on Valtrex sent.      Relevant  Medications   valACYclovir (VALTREX) 500 MG tablet   Iron deficiency anemia    Chronic, ongoing.  Does endorse occasional palpitations and some SOB with exertion.  Did not tolerate Ferrous Sulfate, nausea and GI issues.  Discussed at length with patient, suspect anemia related to heavier menstrual cycles.  Will change to Slow Fe daily, which may be better tolerated and offer less side effects.  Educated her on this.  Recheck labs next visit and if does not tolerate supplement or minimal improvement on labs, discussed with her considering referral to hematology for possible infusions.        Relevant Medications   ferrous sulfate (SLOW IRON) 160 (50 Fe) MG TBCR SR tablet   Post-concussion headache    Ongoing since concussion on 06/11/22, overall imaging in ER reassuring.  Recommend she restart Vit D3/K supplement and Magnesium which offered her benefit.  She wishes to hold off on prescription medication, discussed Gabapentin use  at night with her or Amitriptyline.  Provide Toradol shot in office today for current headache.  May use Tylenol or Ibuprofen as home as needed.  Labs next visit.      Other Visit Diagnoses     Encounter to establish care       New patient to office, introduced to office setting and provider.       Return in about 6 weeks (around 09/12/2022) for T2DM, HEADACHES, ANEMIA -- labs and pap smear.   Marjie Skiff, NP

## 2022-08-01 NOTE — Assessment & Plan Note (Signed)
Chronic, ongoing.  Does endorse occasional palpitations and some SOB with exertion.  Did not tolerate Ferrous Sulfate, nausea and GI issues.  Discussed at length with patient, suspect anemia related to heavier menstrual cycles.  Will change to Slow Fe daily, which may be better tolerated and offer less side effects.  Educated her on this.  Recheck labs next visit and if does not tolerate supplement or minimal improvement on labs, discussed with her considering referral to hematology for possible infusions.

## 2022-08-01 NOTE — Assessment & Plan Note (Signed)
Oral cold sores on occasion.  Refills on Valtrex sent.

## 2022-08-01 NOTE — Assessment & Plan Note (Signed)
Ongoing since concussion on 06/11/22, overall imaging in ER reassuring.  Recommend she restart Vit D3/K supplement and Magnesium which offered her benefit.  She wishes to hold off on prescription medication, discussed Gabapentin use at night with her or Amitriptyline.  Provide Toradol shot in office today for current headache.  May use Tylenol or Ibuprofen as home as needed.  Labs next visit.

## 2022-08-01 NOTE — Assessment & Plan Note (Addendum)
Ongoing since initial diagnosis on 06/02/22 with A1c 7.3% -- trending up since checks 2020 and 2022.  Discussed with patient.  Strong family history in females of this.  Educated on treatment options, will start Metformin 500 MG BID and adjust as needed.  Educated on this medication use and side effects, to reach out to provider if any ADR.  Plan to send in glucometer and supplies next visit, then will have check BS a few times a week.  Recommend heavy focus on diet changes at home.  Check A1c, urine ALB, CMP, Lipid in July when due. - No current statin or ACE/ARB, will further discuss future visits - Recommend eye exam [facility]) and send for recent recommends. - Foot exam due - Will discuss PCV20 future visit.

## 2022-08-03 ENCOUNTER — Encounter: Payer: Self-pay | Admitting: Nurse Practitioner

## 2022-09-10 NOTE — Patient Instructions (Signed)
Please call to schedule your mammogram and/or bone density: [facility] at [facility]  Address: [address], [place], [place] [address] Phone: [phone]  [facility] [address] [place],  [place]  [address] Phone: [phone]    Be Involved in Caring For Your Health:  Taking Medications When medications are taken as directed, they can greatly improve your health. But if they are not taken as prescribed, they may not work. In some cases, not taking them correctly can be harmful. To help ensure your treatment remains effective and safe, understand your medications and how to take them. Bring your medications to each visit for review by your provider.  Your lab results, notes, and after visit summary will be available on My Chart. We strongly encourage you to use this feature. If lab results are abnormal the clinic will contact you with the appropriate steps. If the clinic does not contact you assume the results are satisfactory. You can always view your results on My Chart. If you have questions regarding your health or results, please contact the clinic during office hours. You can also ask questions on My Chart.  We at [facility] are grateful that you chose [place] to provide your care. We strive to provide evidence-based and compassionate care and are always looking for feedback. If you get a survey from the clinic please complete this so we can hear your opinions.  Diabetes Mellitus Basics  Diabetes mellitus, or diabetes, is a long-term (chronic) disease. It occurs when the body does not properly use sugar (glucose) that is released from food after you eat. Diabetes mellitus may be caused by one or both of these problems: Your pancreas does not make enough of a hormone called insulin. Your body does not react in a normal way to the insulin that it makes. Insulin lets glucose enter cells in your body. This  gives you energy. If you have diabetes, glucose cannot get into cells. This causes high blood glucose (hyperglycemia). How to treat and manage diabetes You may need to take insulin or other diabetes medicines daily to keep your glucose in balance. If you are prescribed insulin, you will learn how to give yourself insulin by injection. You may need to adjust the amount of insulin you take based on the foods that you eat. You will need to check your blood glucose levels using a glucose monitor as told by your health care provider. The readings can help determine if you have low or high blood glucose. Generally, you should have these blood glucose levels: Before meals (preprandial): 80-130 mg/dL (5.7-8.4 mmol/L). After meals (postprandial): below 180 mg/dL (10 mmol/L). Hemoglobin A1c (HbA1c) level: less than 7%. Your health care provider will set treatment goals for you. Keep all follow-up visits. This is important. Follow these instructions at home: Diabetes medicines Take your diabetes medicines every day as told by your health care provider. List your diabetes medicines here: Name of medicine: ______________________________ Amount (dose): _______________ Time (a.m./p.m.): _______________ Notes: ___________________________________ Name of medicine: ______________________________ Amount (dose): _______________ Time (a.m./p.m.): _______________ Notes: ___________________________________ Name of medicine: ______________________________ Amount (dose): _______________ Time (a.m./p.m.): _______________ Notes: ___________________________________ Insulin If you use insulin, list the types of insulin you use here: Insulin type: ______________________________ Amount (dose): _______________ Time (a.m./p.m.): _______________Notes: ___________________________________ Insulin type: ______________________________ Amount (dose): _______________ Time (a.m./p.m.): _______________ Notes:  ___________________________________ Insulin type: ______________________________ Amount (dose): _______________ Time (a.m./p.m.): _______________ Notes: ___________________________________ Insulin type: ______________________________ Amount (dose): _______________  Time (a.m./p.m.): _______________ Notes: ___________________________________ Insulin type: ______________________________ Amount (dose): _______________ Time (a.m./p.m.): _______________ Notes: ___________________________________ Managing blood glucose  Check your blood glucose levels using a glucose monitor as told by your health care provider. Write down the times that you check your glucose levels here: Time: _______________ Notes: ___________________________________ Time: _______________ Notes: ___________________________________ Time: _______________ Notes: ___________________________________ Time: _______________ Notes: ___________________________________ Time: _______________ Notes: ___________________________________ Time: _______________ Notes: ___________________________________  Low blood glucose Low blood glucose (hypoglycemia) is when glucose is at or below 70 mg/dL (3.9 mmol/L). Symptoms may include: Feeling: Hungry. Sweaty and clammy. Irritable or easily upset. Dizzy. Sleepy. Having: A fast heartbeat. A headache. A change in your vision. Numbness around the mouth, lips, or tongue. Having trouble with: Moving (coordination). Sleeping. Treating low blood glucose To treat low blood glucose, eat or drink something containing sugar right away. If you can think clearly and swallow safely, follow the 15:15 rule: Take 15 grams of a fast-acting carb (carbohydrate), as told by your health care provider. Some fast-acting carbs are: Glucose tablets: take 3-4 tablets. Hard candy: eat 3-5 pieces. Fruit juice: drink 4 oz (120 mL). Regular (not diet) soda: drink 4-6 oz (120-180 mL). Honey or sugar: eat 1 Tbsp (15  mL). Check your blood glucose levels 15 minutes after you take the carb. If your glucose is still at or below 70 mg/dL (3.9 mmol/L), take 15 grams of a carb again. If your glucose does not go above 70 mg/dL (3.9 mmol/L) after 3 tries, get help right away. After your glucose goes back to normal, eat a meal or a snack within 1 hour. Treating very low blood glucose If your glucose is at or below 54 mg/dL (3 mmol/L), you have very low blood glucose (severe hypoglycemia). This is an emergency. Do not wait to see if the symptoms will go away. Get medical help right away. Call your local emergency services (911 in the [place] Do not drive yourself to the hospital. Questions to ask your health care provider Should I talk with a diabetes educator? What equipment will I need to care for myself at home? What diabetes medicines do I need? When should I take them? How often do I need to check my blood glucose levels? What number can I call if I have questions? When is my follow-up visit? Where can I find a support group for people with diabetes? Where to find more information American Diabetes Association: www.diabetes.org Association of Diabetes Care and Education Specialists: www.diabeteseducator.org Contact a health care provider if: Your blood glucose is at or above 240 mg/dL (16.1 mmol/L) for 2 days in a row. You have been sick or have had a fever for 2 days or more, and you are not getting better. You have any of these problems for more than 6 hours: You cannot eat or drink. You feel nauseous. You vomit. You have diarrhea. Get help right away if: Your blood glucose is lower than 54 mg/dL (3 mmol/L). You get confused. You have trouble thinking clearly. You have trouble breathing. These symptoms may represent a serious problem that is an emergency. Do not wait to see if the symptoms will go away. Get medical help right away. Call your local emergency services (911 in the [place] Do not drive  yourself to the hospital. Summary Diabetes mellitus is a chronic disease that occurs when the body does not properly use sugar (glucose) that is released from food after you eat. Take insulin and diabetes medicines as told. Check your blood glucose  every day, as often as told. Keep all follow-up visits. This is important. This information is not intended to replace advice given to you by your health care provider. Make sure you discuss any questions you have with your health care provider. Document Revised: 06/04/2019 Document Reviewed: 06/04/2019 Elsevier Patient Education  2024 [employer].

## 2022-09-12 ENCOUNTER — Encounter: Payer: Self-pay | Admitting: Nurse Practitioner

## 2022-09-12 ENCOUNTER — Ambulatory Visit (INDEPENDENT_AMBULATORY_CARE_PROVIDER_SITE_OTHER): Payer: Self-pay | Admitting: Nurse Practitioner

## 2022-09-12 ENCOUNTER — Other Ambulatory Visit: Payer: Self-pay | Admitting: Nurse Practitioner

## 2022-09-12 VITALS — BP 118/80 | HR 64 | Temp 98.3°F | Ht 67.99 in | Wt 195.6 lb

## 2022-09-12 DIAGNOSIS — Z1159 Encounter for screening for other viral diseases: Secondary | ICD-10-CM

## 2022-09-12 DIAGNOSIS — D508 Other iron deficiency anemias: Secondary | ICD-10-CM

## 2022-09-12 DIAGNOSIS — E1165 Type 2 diabetes mellitus with hyperglycemia: Secondary | ICD-10-CM

## 2022-09-12 DIAGNOSIS — G44309 Post-traumatic headache, unspecified, not intractable: Secondary | ICD-10-CM

## 2022-09-12 DIAGNOSIS — Z1231 Encounter for screening mammogram for malignant neoplasm of breast: Secondary | ICD-10-CM

## 2022-09-12 DIAGNOSIS — R7989 Other specified abnormal findings of blood chemistry: Secondary | ICD-10-CM

## 2022-09-12 LAB — MICROALBUMIN, URINE WAIVED
Creatinine, Urine Waived: 200 mg/dL (ref 10–300)
Microalb, Ur Waived: 30 mg/L — ABNORMAL HIGH (ref 0–19)
Microalb/Creat Ratio: <30 mg/g (ref <30)

## 2022-09-12 LAB — BAYER DCA HB A1C WAIVED: HB A1C (BAYER DCA - WAIVED): 7.2 % — ABNORMAL HIGH (ref 4.8–5.6)

## 2022-09-12 MED ORDER — SLOW IRON 160 (50 FE) MG PO TBCR: 160.0000 mg | EXTENDED_RELEASE_TABLET | Freq: Every day | ORAL | 4 refills | Status: DC

## 2022-09-12 NOTE — Assessment & Plan Note (Signed)
Ongoing headache to left temple and vertigo since concussion on 06/11/22, overall imaging in ER reassuring.  Recommend she start Excedrin over the counter at present.  Once insurance starts in September will plan on trial of Amitriptyline nightly and Meclizine as needed.  Educated her on this regimen.  May use Tylenol or Ibuprofen as home as needed.  Labs today.

## 2022-09-12 NOTE — Assessment & Plan Note (Signed)
Noted on past labs, mild elevation in ALT.  Labs today.

## 2022-09-12 NOTE — Assessment & Plan Note (Signed)
Chronic, ongoing.  Does endorse occasional palpitations and some SOB with exertion.  Did not tolerate Ferrous Sulfate, nausea and GI issues.  Discussed at length with patient, suspect anemia related to heavier menstrual cycles.  Start Slow Fe daily, which may be better tolerated and offer less side effects.  Educated her on this.  Recheck labs.  Discussed with her considering referral to hematology for possible infusions or GYN to assist with heavier cycles in future.

## 2022-09-12 NOTE — Progress Notes (Addendum)
BP 118/80   Pulse 64   Temp 98.3 F (36.8 C) (Oral)   Ht 5' 7.99" (1.727 m)   Wt 195 lb 9.6 oz (88.7 kg)   SpO2 100%   BMI 29.75 kg/m    Subjective:    Patient ID: Daisy Ross, female    DOB: 22-Aug-1975, 47 y.o.   MRN: [id number]  HPI: KYLY BAERT is a 47 y.o. female  Chief Complaint  Patient presents with   Diabetes   Headache   Anemia   Dizziness    Still having Vertigo   DIABETES A1c 7.3% on 06/02/22.  Is tolerating Metformin, no side effects. Hypoglycemic episodes:no Polydipsia/polyuria: no Visual disturbance: no Chest pain: no Paresthesias: no Glucose Monitoring: no  Accucheck frequency: Not Checking  Fasting glucose:  Post prandial:  Evening:  Before meals: Taking Insulin?: no  Long acting insulin:  Short acting insulin: Blood Pressure Monitoring: not checking Retinal Examination: Not up to Date Foot Exam: Up to Date Pneumovax: Not up to Date Influenza: Up to Date Aspirin: no   ANEMIA Noted on labs 06/02/22 with H/H 10.3/32.7 and iron 24.  Did not have iron at the drug store when picked up medications. Anemia status: uncontrolled Etiology of anemia: heavier menstrual cycles Duration of anemia treatment: months Compliance with treatment: poor compliance Iron supplementation side effects: no Severity of anemia: mild Fatigue: yes Decreased exercise tolerance: no  Dyspnea on exertion: sometimes Palpitations: sometimes Bleeding: yes above Pica: no   DIZZINESS Had recent concussion after slamming into door.  Seen in ER on 06/11/22.  Continues to have vertigo and headaches from this + sharp intermittent pain to left temple.  She notices increase pain is drinks alcohol.  Was taking D3/K vitamin and this helped her sleep and helped headaches + Magnesium helped.   Had CT head and orbits in ER which were reassuring.   Duration: months Description of symptoms: lightheaded, if lying down the room spins Duration of episode: minutes to hours Provoking  factors: none Aggravating factors:  none Triggered by rolling over in bed: yes Triggered by bending over: no Aggravated by head movement: no Aggravated by exertion, coughing, loud noises: no Recent head injury: yes Recent or current viral symptoms: no History of vasovagal episodes: no Nausea: yes Vomiting: no Tinnitus: no Hearing loss: no Aural fullness: no Headache: yes Photophobia/phonophobia:  occasionally sound Unsteady gait: no Postural instability: no Diplopia, dysarthria, dysphagia or weakness: no Related to exertion: no Pallor: no Diaphoresis: no Dyspnea: no Chest pain: no   Relevant past medical, surgical, family and social history reviewed and updated as indicated. Interim medical history since our last visit reviewed. Allergies and medications reviewed and updated.  Review of Systems  Constitutional:  Negative for activity change, appetite change, diaphoresis, fatigue and fever.  Respiratory:  Negative for cough, chest tightness and shortness of breath.   Cardiovascular:  Negative for chest pain, palpitations and leg swelling.  Gastrointestinal: Negative.   Endocrine: Negative for cold intolerance, heat intolerance, polydipsia, polyphagia and polyuria.  Neurological:  Positive for headaches. Negative for dizziness, syncope, weakness, light-headedness and numbness.  Psychiatric/Behavioral: Negative.      Per HPI unless specifically indicated above     Objective:    BP 118/80   Pulse 64   Temp 98.3 F (36.8 C) (Oral)   Ht 5' 7.99" (1.727 m)   Wt 195 lb 9.6 oz (88.7 kg)   SpO2 100%   BMI 29.75 kg/m   Wt Readings from Last 3  Encounters:  09/12/22 195 lb 9.6 oz (88.7 kg)  08/01/22 193 lb (87.5 kg)  07/19/19 190 lb (86.2 kg)    Physical Exam Vitals and nursing note reviewed.  Constitutional:      General: She is awake. She is not in acute distress.    Appearance: She is well-developed and well-groomed. She is obese. She is not ill-appearing or  toxic-appearing.  HENT:     Head: Normocephalic.     Right Ear: Hearing and external ear normal.     Left Ear: Hearing and external ear normal.  Eyes:     General: Lids are normal.        Right eye: No discharge.        Left eye: No discharge.     Conjunctiva/sclera: Conjunctivae normal.     Pupils: Pupils are equal, round, and reactive to light.  Neck:     Thyroid: No thyromegaly.     Vascular: No carotid bruit.  Cardiovascular:     Rate and Rhythm: Normal rate and regular rhythm.     Heart sounds: Normal heart sounds. No murmur heard.    No gallop.  Pulmonary:     Effort: Pulmonary effort is normal. No accessory muscle usage or respiratory distress.     Breath sounds: Normal breath sounds.  Abdominal:     General: Bowel sounds are normal. There is no distension.     Palpations: Abdomen is soft.     Tenderness: There is no abdominal tenderness.  Musculoskeletal:     Cervical back: Normal range of motion and neck supple.     Right lower leg: No edema.     Left lower leg: No edema.  Lymphadenopathy:     Cervical: No cervical adenopathy.  Skin:    General: Skin is warm and dry.  Neurological:     Mental Status: She is alert and oriented to person, place, and time.     Deep Tendon Reflexes: Reflexes are normal and symmetric.     Reflex Scores:      Brachioradialis reflexes are 2+ on the right side and 2+ on the left side.      Patellar reflexes are 2+ on the right side and 2+ on the left side. Psychiatric:        Attention and Perception: Attention normal.        Mood and Affect: Mood normal.        Speech: Speech normal.        Behavior: Behavior normal. Behavior is cooperative.        Thought Content: Thought content normal.     Results for orders placed or performed in visit on 08/03/22  Hemoglobin A1c  Result Value Ref Range   Hemoglobin A1C 7.3   HM DIABETES FOOT EXAM  Result Value Ref Range   HM Diabetic Foot Exam see result in chart       Assessment &  Plan:   Problem List Items Addressed This Visit       Endocrine   Type 2 diabetes mellitus (HCC) - Primary    Ongoing, initial diagnosis 06/02/22 with A1c 7.3%, today A1c 7.2%, just started Metformin and is tolerating.  Discussed with patient.  Strong family history in females of this.  Educated on this medication use and side effects, to reach out to provider if any ADR.  Plan to send in glucometer and supplies next visit, then will have check BS a few times a week.  Recommend heavy focus on  diet changes at home.  Check A1c, urine ALB, CMP, Lipid. - No current statin or ACE/ARB, will further discuss future visits - Recommend eye exam ([facility]) and have them send records to [place]. - Foot exam due - Will discuss PCV20 future visit.      Relevant Orders   Bayer DCA Hb A1c Waived   Microalbumin, Urine Waived   Lipid Panel w/o Chol/HDL Ratio   TSH     Other   Elevated LFTs    Noted on past labs, mild elevation in ALT.  Labs today.      Relevant Orders   Comprehensive metabolic panel   Iron deficiency anemia    Chronic, ongoing.  Does endorse occasional palpitations and some SOB with exertion.  Did not tolerate Ferrous Sulfate, nausea and GI issues.  Discussed at length with patient, suspect anemia related to heavier menstrual cycles.  Start Slow Fe daily, which may be better tolerated and offer less side effects.  Educated her on this.  Recheck labs.  Discussed with her considering referral to hematology for possible infusions or GYN to assist with heavier cycles in future.        Relevant Medications   ferrous sulfate (SLOW IRON) 160 (50 Fe) MG TBCR SR tablet   Other Relevant Orders   CBC with Differential/Platelet   Iron Binding Cap (TIBC)([facility])   Ferritin   Vitamin B12   Celiac Disease Panel   Post-concussion headache    Ongoing headache to left temple and vertigo since concussion on 06/11/22, overall imaging in ER reassuring.  Recommend she start Excedrin over the  counter at present.  Once insurance starts in September will plan on trial of Amitriptyline nightly and Meclizine as needed.  Educated her on this regimen.  May use Tylenol or Ibuprofen as home as needed.  Labs today.      Other Visit Diagnoses     Need for hepatitis C screening test       Hep C screen on labs today per guidelines for one time screening, discussed with patient.   Relevant Orders   Hepatitis C antibody   Encounter for screening mammogram for malignant neoplasm of breast       Mammogram ordered and instructed on how to schedule.   Relevant Orders   MM 3D SCREENING MAMMOGRAM BILATERAL BREAST        Follow up plan: Return in about 6 weeks (around 10/24/2022) for POST CONCUSSION HEADACHE.

## 2022-09-12 NOTE — Assessment & Plan Note (Signed)
Ongoing, initial diagnosis 06/02/22 with A1c 7.3%, today A1c 7.2%, just started Metformin and is tolerating.  Discussed with patient.  Strong family history in females of this.  Educated on this medication use and side effects, to reach out to provider if any ADR.  Plan to send in glucometer and supplies next visit, then will have check BS a few times a week.  Recommend heavy focus on diet changes at home.  Check A1c, urine ALB, CMP, Lipid. - No current statin or ACE/ARB, will further discuss future visits - Recommend eye exam ([facility]) and have them send records to [place]. - Foot exam due - Will discuss PCV20 future visit.

## 2022-09-13 NOTE — Telephone Encounter (Signed)
Requested medication (s) are due for refill today: see request  Requested medication (s) are on the active medication list: yes #90 4 refills Last refill:  09/12/22   Future visit scheduled: yes in 1 month  Notes to clinic:  Pharmacy comment: Alternative Requested:WE CANNOT ORDER THIS PRODUCT PLEASE RESEND DIFFERENT IRON.   Please advise     Requested Prescriptions  Pending Prescriptions Disp Refills   IRON SUPPLEMENT 220 (44 Fe) MG/5ML SOLN [Pharmacy Med Name: FERROUS SULF 220 MG/5 ML LIQ] 90 mL 4    Sig: EVERY DAY     Endocrinology:  Minerals - Iron Supplementation Failed - 09/12/2022  9:19 AM      Failed - HGB in normal range and within 360 days    Hemoglobin  Date Value Ref Range Status  09/12/2022 11.6 11.1 - 15.9 g/dL Final         Failed - HCT in normal range and within 360 days    Hematocrit  Date Value Ref Range Status  09/12/2022 36.1 34.0 - 46.6 % Final         Failed - RBC in normal range and within 360 days    RBC  Date Value Ref Range Status  09/12/2022 4.60 3.77 - 5.28 x10E6/uL Final  05/21/2019 4.30 3.87 - 5.11 MIL/uL Final         Failed - Fe (serum) in normal range and within 360 days    Iron  Date Value Ref Range Status  09/12/2022 49 27 - 159 ug/dL Final   Iron Saturation  Date Value Ref Range Status  09/12/2022 12 (L) 15 - 55 % Final         Failed - Ferritin in normal range and within 360 days    Ferritin  Date Value Ref Range Status  09/12/2022 16 15 - 150 ng/mL Final         Passed - Valid encounter within last 12 months    Recent Outpatient Visits           Yesterday Type 2 diabetes mellitus with hyperglycemia, without long-term current use of insulin (HCC)   [place] [facility] [place], [place] T, NP   1 month ago Type 2 diabetes mellitus with hyperglycemia, without long-term current use of insulin (HCC)   [place] [facility] [place], Dorie Rank, NP       Future Appointments             In 1  month [place], Dorie Rank, NP  [facility], [facility]

## 2022-09-13 NOTE — Progress Notes (Signed)
Contacted via MyChart   Good morning Daisy Ross, your labs are starting to return: - CBC is overall stable, but iron is on lower side of normal.  I would recommend getting over the counter Slow Fe and taking one tablet daily as ferritin on lower side of normal as well. -  Kidney function, creatinine and eGFR, remains norma. Liver function continues to have mild elevations which we will monitor closely, if sugar levels improve this may improve as well. - Cholesterol levels show elevation in LDL, which we will discuss at next visit about guidelines for diabetes and cholesterol:) - TSH level normal, Thyroid. - Celiac testing, remainder is still pending, but there is elevation in IgA -- we will see what remainder shows.  Any questions? Keep being amazing!!  Thank you for allowing me to participate in your care.  I appreciate you. Kindest regards, Amabel Stmarie

## 2022-09-14 NOTE — Progress Notes (Signed)
Contacted via MyChart   The remainder of Celiac testing is negative.  I suspect the IgA/Immunoglobulin A is elevated related to elevations in your liver function testing.  We will monitor liver closely.  Any questions?

## 2022-10-16 DIAGNOSIS — K8050 Calculus of bile duct without cholangitis or cholecystitis without obstruction: Secondary | ICD-10-CM

## 2022-10-16 HISTORY — DX: Calculus of bile duct without cholangitis or cholecystitis without obstruction: K80.50

## 2022-10-23 ENCOUNTER — Emergency Department
Admission: EM | Admit: 2022-10-23 | Discharge: 2022-10-23 | Disposition: A | Discharge status: Home / Self Care | Payer: Commercial Managed Care - PPO | Attending: Emergency Medicine | Admitting: Emergency Medicine

## 2022-10-23 ENCOUNTER — Emergency Department: Payer: Commercial Managed Care - PPO

## 2022-10-23 ENCOUNTER — Other Ambulatory Visit: Payer: Self-pay

## 2022-10-23 DIAGNOSIS — R109 Unspecified abdominal pain: Secondary | ICD-10-CM | POA: Diagnosis present

## 2022-10-23 DIAGNOSIS — E119 Type 2 diabetes mellitus without complications: Secondary | ICD-10-CM | POA: Diagnosis not present

## 2022-10-23 DIAGNOSIS — R1011 Right upper quadrant pain: Secondary | ICD-10-CM | POA: Diagnosis not present

## 2022-10-23 DIAGNOSIS — R110 Nausea: Secondary | ICD-10-CM | POA: Insufficient documentation

## 2022-10-23 DIAGNOSIS — K828 Other specified diseases of gallbladder: Secondary | ICD-10-CM

## 2022-10-23 LAB — COMPREHENSIVE METABOLIC PANEL
ALT: 62 U/L — ABNORMAL HIGH (ref 0–44)
AST: 57 U/L — ABNORMAL HIGH (ref 15–41)
Albumin: 4.1 g/dL (ref 3.5–5.0)
Alkaline Phosphatase: 64 U/L (ref 38–126)
Anion gap: 10 (ref 5–15)
BUN: 12 mg/dL (ref 6–20)
CO2: 23 mmol/L (ref 22–32)
Calcium: 9.1 mg/dL (ref 8.9–10.3)
Chloride: 103 mmol/L (ref 98–111)
Creatinine, Ser: 0.63 mg/dL (ref 0.44–1.00)
GFR, Estimated: >60 mL/min (ref >60)
Glucose, Bld: 130 mg/dL — ABNORMAL HIGH (ref 70–99)
Potassium: 4.2 mmol/L (ref 3.5–5.1)
Sodium: 136 mmol/L (ref 135–145)
Total Bilirubin: 0.5 mg/dL (ref 0.3–1.2)
Total Protein: 8 g/dL (ref 6.5–8.1)

## 2022-10-23 LAB — CBC WITH DIFFERENTIAL/PLATELET
Abs Immature Granulocytes: 0.05 10*3/uL (ref 0.00–0.07)
Basophils Absolute: 0.1 10*3/uL (ref 0.0–0.1)
Basophils Relative: 0 %
Eosinophils Absolute: 0 10*3/uL (ref 0.0–0.5)
Eosinophils Relative: 0 %
HCT: 39.2 % (ref 36.0–46.0)
Hemoglobin: 12.1 g/dL (ref 12.0–15.0)
Immature Granulocytes: 0 %
Lymphocytes Relative: 8 %
Lymphs Abs: 1.2 10*3/uL (ref 0.7–4.0)
MCH: 25.1 pg — ABNORMAL LOW (ref 26.0–34.0)
MCHC: 30.9 g/dL (ref 30.0–36.0)
MCV: 81.3 fL (ref 80.0–100.0)
Monocytes Absolute: 1.1 10*3/uL — ABNORMAL HIGH (ref 0.1–1.0)
Monocytes Relative: 8 %
Neutro Abs: 12.2 10*3/uL — ABNORMAL HIGH (ref 1.7–7.7)
Neutrophils Relative %: 84 %
Platelets: 310 10*3/uL (ref 150–400)
RBC: 4.82 MIL/uL (ref 3.87–5.11)
RDW: 16.3 % — ABNORMAL HIGH (ref 11.5–15.5)
WBC: 14.7 10*3/uL — ABNORMAL HIGH (ref 4.0–10.5)
nRBC: 0 % (ref 0.0–0.2)

## 2022-10-23 LAB — URINALYSIS, ROUTINE W REFLEX MICROSCOPIC
Bilirubin Urine: NEGATIVE
Glucose, UA: NEGATIVE mg/dL
Hgb urine dipstick: NEGATIVE
Ketones, ur: NEGATIVE mg/dL
Leukocytes,Ua: NEGATIVE
Nitrite: NEGATIVE
Protein, ur: NEGATIVE mg/dL
Specific Gravity, Urine: 1.009 (ref 1.005–1.030)
pH: 6 (ref 5.0–8.0)

## 2022-10-23 LAB — POC URINE PREG, ED: Preg Test, Ur: NEGATIVE

## 2022-10-23 LAB — LIPASE, BLOOD: Lipase: 27 U/L (ref 11–51)

## 2022-10-23 MED ORDER — HYDROCODONE-ACETAMINOPHEN 5-325 MG PO TABS: 1.0000 | ORAL_TABLET | ORAL | 0 refills | Status: DC | PRN

## 2022-10-23 MED ORDER — AMOXICILLIN-POT CLAVULANATE 875-125 MG PO TABS
1.0000 | ORAL_TABLET | Freq: Once | ORAL | Status: AC
  Administered 2022-10-23: 1 via ORAL
  Filled 2022-10-23: qty 1

## 2022-10-23 MED ORDER — AMOXICILLIN-POT CLAVULANATE 875-125 MG PO TABS: 1.0000 | ORAL_TABLET | Freq: Two times a day (BID) | ORAL | 0 refills | Status: AC

## 2022-10-23 MED ORDER — LACTATED RINGERS IV BOLUS
1000.0000 mL | Freq: Once | INTRAVENOUS | Status: AC
  Administered 2022-10-23: 1000 mL via INTRAVENOUS

## 2022-10-23 MED ORDER — MORPHINE SULFATE (PF) 4 MG/ML IV SOLN
4.0000 mg | Freq: Once | INTRAVENOUS | Status: AC
  Administered 2022-10-23: 4 mg via INTRAVENOUS
  Filled 2022-10-23: qty 1

## 2022-10-23 MED ORDER — GADOBUTROL 1 MMOL/ML IV SOLN
9.0000 mL | Freq: Once | INTRAVENOUS | Status: AC | PRN
  Administered 2022-10-23: 9 mL via INTRAVENOUS

## 2022-10-23 MED ORDER — DICYCLOMINE HCL 10 MG PO CAPS: 10.0000 mg | ORAL_CAPSULE | Freq: Three times a day (TID) | ORAL | 0 refills | Status: DC

## 2022-10-23 MED ORDER — ONDANSETRON HCL 4 MG/2ML IJ SOLN
4.0000 mg | Freq: Once | INTRAMUSCULAR | Status: AC
  Administered 2022-10-23: 4 mg via INTRAVENOUS
  Filled 2022-10-23: qty 2

## 2022-10-23 MED ORDER — ONDANSETRON 8 MG PO TBDP: 8.0000 mg | ORAL_TABLET | Freq: Three times a day (TID) | ORAL | 0 refills | Status: DC | PRN

## 2022-10-23 NOTE — ED Provider Triage Note (Signed)
Emergency Medicine Provider Triage Evaluation Note  Daisy Ross , a 47 y.o. female  was evaluated in triage.  Pt complains of right sided upper abdominal pain and right back pain since yesterday. Feels nauseated and vomiting x1. No fever but feels sweaty. Hasn't eaten anything but thinks food makes it worse.    Review of Systems  Positive: Abd pain, n/v Negative: diarrhea  Physical Exam  There were no vitals taken for this visit. Gen:   Awake, appears uncomfortable Resp:  Normal effort  MSK:   Moves extremities without difficulty  Other:    Medical Decision Making  Medically screening exam initiated at 12:15 PM.  Appropriate orders placed.  Essie Hart was informed that the remainder of the evaluation will be completed by another provider, this initial triage assessment does not replace that evaluation, and the importance of remaining in the ED until their evaluation is complete.     Jackelyn Hoehn, PA-C 10/23/22 1220

## 2022-10-23 NOTE — ED Triage Notes (Signed)
Right flank pain radiating to right lower abd pain started yesterday, nausea/diarrhea started today. Water made pain worst

## 2022-10-23 NOTE — ED Provider Notes (Signed)
Emergency department handoff note  Care of this patient was signed out to me at the end of the previous provider shift.  All pertinent patient information was conveyed and all questions were answered.  Patient pending MRCP showing only gallbladder sludge without evidence of active gallbladder infection.  Given the patient does have white blood cell count elevation with gallbladder sludge, will treat empirically with Augmentin and provide gastroenterology follow-up.  The patient has been reexamined and is ready to be discharged.  All diagnostic results have been reviewed and discussed with the patient/family.  Care plan has been outlined and the patient/family understands all current diagnoses, results, and treatment plans.  There are no new complaints, changes, or physical findings at this time.  All questions have been addressed and answered.  All medications, if any, that were given while in the emergency department or any that are being prescribed have been reviewed with the patient/family.  All side effects and adverse reactions have been explained.  Patient was instructed to, and agrees to follow-up with their primary care physician as well as return to the emergency department if any new or worsening symptoms develop.   Merwyn Katos, MD 10/23/22 [phone]

## 2022-10-23 NOTE — Patient Instructions (Signed)
Post-Concussion Syndrome  A concussion is a brain injury from a direct hit to your head or body. This hit makes your brain shake fast in your skull. Normally, brain injuries are not life-threatening. Post-concussion syndrome is when symptoms last longer than normal after a head injury. What are the causes? The cause of this condition is not known. It can happen if your head injury was mild or very bad. What increases the risk? Being female. Being young. Having had a head injury before. Having had headaches a lot before. Being sad (depressed) or feeling worried or nervous (anxious). Having more than one symptom or very bad symptoms when you got injured. Fainting when you got your concussion or not being able to remember it. What are the signs or symptoms? After a head injury, you may have physical symptoms, such as: Headaches. Feeling tired, dizzy, or weak. Trouble seeing. Having eye trouble in bright lights. Trouble hearing. Problems with balance. You may also have symptoms that affect your thinking or your feelings, such as: Not being able to remember things. Not being able to focus. Trouble falling asleep or staying asleep (insomnia). Feeling grouchy (irritable). Feeling worried or sad. Trouble learning new things. Symptoms can last weeks or months. Sometimes, symptoms are serious. How is this treated? Treatment may depend on your symptoms. These normally go away on their own with time. If you need treatment, it may include: Medicines. Resting your brain and body for a few days. Doing therapy to help you heal (rehab therapy). This may include: Physical or occupational therapy. This may include exercises. Talking to a counselor. Speech therapy. Therapy to help your eyes. Follow these instructions at home: Medicines Take over-the-counter and prescription medicines only as told by your doctor. Avoid pain medicines that have opioids in them. Activity Limit activities as  told by your doctor. This may include not doing these things: Homework. Work for your job. Hard thinking. Watching TV. Using a computer or phone. Puzzles and games for your brain. Exercise and sports. Slowly return to your normal activities as told by your doctor. Slow down or stop an activity if you get symptoms. Rest. Try to sleep 7-9 hours each night. Also, try taking naps or breaks when you feel tired during the day. Do not do anything that could cause you to get hurt again right away. Getting hurt again can harm your brain. General instructions  Do not drink alcohol until your doctor says that you can. Keep track of your symptoms, and tell your doctor about them. Keep all follow-up visits. Your doctors may need to check you for new or serious symptoms. Where to find more information [employer]: concussionfoundation.org Contact a doctor if: You do not improve. You get injured again. You have changes in how you act. Get help right away if: You have a very bad headache or a headache that gets worse. You can't stop vomiting. Any part of your body feels weak or loses feeling. You feel mixed up (confused). You have trouble talking. You feel very sleepy. You faint or you have a seizure. These symptoms may be an emergency. Get help right away. Call 911. Do not wait to see if the symptoms will go away. Do not drive yourself to the hospital. Also, get help right away if: You think about hurting yourself or others. Take one of these steps if you feel like you may hurt yourself or others, or have thoughts about taking your own life: Go to your nearest emergency room.  Call 911. Call the National Suicide Prevention Lifeline at 604-286-5422 or 988. This is open 24 hours a day. Text the Crisis Text Line at 650-656-5841. This information is not intended to replace advice given to you by your health care provider. Make sure you discuss any questions you have with your health  care provider. Document Revised: 06/25/2021 Document Reviewed: 06/25/2021 Elsevier Patient Education  2024 [employer].

## 2022-10-23 NOTE — ED Provider Notes (Signed)
[facility] Provider Note    Event Date/Time   First MD Initiated Contact with Patient 10/23/22 1303     (approximate)   History   Back Pain (Right flank pain radiating to right lower abd pain started yesterday, nausea/diarrhea started today. Water made pain worst)   HPI Daisy Ross is a 47 y.o. female with DM2 presenting today for right flank pain.  Patient notes having right-sided back pain/right flank pain that started yesterday morning.  Symptoms were intermittent but sharp in nature.  Has started noticing pain wrapping around to right upper abdomen.  Has also had nausea without vomiting as well as chills.  Currently denying cough, congestion, shortness of breath, chest pain, diarrhea, constipation, dysuria, hematuria.  No prior surgeries to abdomen.     Physical Exam   Triage Vital Signs: ED Triage Vitals  Encounter Vitals Group     BP 10/23/22 1258 126/68     Systolic BP Percentile --      Diastolic BP Percentile --      Pulse Rate 10/23/22 1258 66     Resp 10/23/22 1258 18     Temp 10/23/22 1258 98 F (36.7 C)     Temp Source 10/23/22 1258 Oral     SpO2 10/23/22 1258 100 %     Weight 10/23/22 1219 200 lb (90.7 kg)     Height 10/23/22 1219 5\' 7"  (1.702 m)     Head Circumference --      Peak Flow --      Pain Score 10/23/22 1219 10     Pain Loc --      Pain Education --      Exclude from Growth Chart --     Most recent vital signs: Vitals:   10/23/22 1258  BP: 126/68  Pulse: 66  Resp: 18  Temp: 98 F (36.7 C)  SpO2: 100%   Physical Exam: I have reviewed the vital signs and nursing notes. General: Awake, alert, no acute distress.  Nontoxic appearing. Head:  Atraumatic, normocephalic.   ENT:  EOM intact, PERRL. Oral mucosa is pink and moist with no lesions. Neck: Neck is supple with full range of motion, No meningeal signs. Cardiovascular:  RRR, No murmurs. Peripheral pulses palpable and equal bilaterally. Respiratory:   Symmetrical chest wall expansion.  No rhonchi, rales, or wheezes.  Good air movement throughout.  No use of accessory muscles.   Musculoskeletal:  No cyanosis or edema. Moving extremities with full ROM Abdomen:  Soft, tenderness to palpation in epigastric and right upper quadrant.  Positive right-sided CVA tenderness, nondistended. Neuro:  GCS 15, moving all four extremities, interacting appropriately. Speech clear. Psych:  Calm, appropriate.   Skin:  Warm, dry, no rash.     ED Results / Procedures / Treatments   Labs (all labs ordered are listed, but only abnormal results are displayed) Labs Reviewed  COMPREHENSIVE METABOLIC PANEL - Abnormal; Notable for the following components:      Result Value   Glucose, Bld 130 (*)    AST 57 (*)    ALT 62 (*)    All other components within normal limits  CBC WITH DIFFERENTIAL/PLATELET - Abnormal; Notable for the following components:   WBC 14.7 (*)    MCH 25.1 (*)    RDW 16.3 (*)    Neutro Abs 12.2 (*)    Monocytes Absolute 1.1 (*)    All other components within normal limits  LIPASE, BLOOD  URINALYSIS, ROUTINE W  REFLEX MICROSCOPIC  POC URINE PREG, ED     EKG    RADIOLOGY Independently interpreted right upper quadrant ultrasound which is equivocal for acute cholecystitis with positive sonographic Murphy sign as well as biliary sludge but no other pericholecystic fluid or wall thickening.   PROCEDURES:  Critical Care performed: No  Procedures   MEDICATIONS ORDERED IN ED: Medications  lactated ringers bolus 1,000 mL (1,000 mLs Intravenous New Bag/Given 10/23/22 1401)  morphine (PF) 4 MG/ML injection 4 mg (4 mg Intravenous Given 10/23/22 1402)  ondansetron (ZOFRAN) injection 4 mg (4 mg Intravenous Given 10/23/22 1402)     IMPRESSION / MDM / ASSESSMENT AND PLAN / ED COURSE  I reviewed the triage vital signs and the nursing notes.                              Differential diagnosis includes, but is not limited to,  cholecystitis, nephrolithiasis, pyelonephritis, UTI, viral gastroenteritis, gastritis, peptic ulcer disease.  Patient's presentation is most consistent with acute presentation with potential threat to life or bodily function.  Patient is a 47 year old female presenting today for right upper quadrant and right flank pain associated with nausea and chills.  Vital signs stable on arrival but exam notable for tenderness to palpation in the right upper quadrant and epigastric regions.  Laboratory workup notable for leukocytosis at 14.7 K.  Lipase normal.  No elevation in T. bili.  LFTs are slightly bumped but comparable to most recent laboratory workup.  Right upper quadrant ultrasound is equivocal for acute cholecystitis.  Follow-up imaging with MRCP ordered.  Patient given 1 L fluids, Zofran, and morphine with improvement in symptoms.  Patient signed out to oncoming provider while awaiting results of MRCP.  The patient is on the cardiac monitor to evaluate for evidence of arrhythmia and/or significant heart rate changes. Clinical Course as of 10/23/22 1515  Sun Oct 23, 2022  1402 WBC(!): 14.7 [DW]  1403 Comprehensive metabolic panel(!) LFTs comparable to prior labs [DW]    Clinical Course User Index [DW] Janith Lima, MD     FINAL CLINICAL IMPRESSION(S) / ED DIAGNOSES   Final diagnoses:  Right upper quadrant pain  Nausea without vomiting     Rx / DC Orders   ED Discharge Orders     None        Note:  This document was prepared using Dragon voice recognition software and may include unintentional dictation errors.   Janith Lima, MD 10/23/22 [phone]

## 2022-10-24 ENCOUNTER — Ambulatory Visit: Payer: Commercial Managed Care - PPO | Admitting: Nurse Practitioner

## 2022-10-24 ENCOUNTER — Encounter: Payer: Self-pay | Admitting: Nurse Practitioner

## 2022-10-24 VITALS — BP 115/72 | HR 56 | Temp 98.0°F | Ht 67.9 in | Wt 194.0 lb

## 2022-10-24 DIAGNOSIS — K8050 Calculus of bile duct without cholangitis or cholecystitis without obstruction: Secondary | ICD-10-CM | POA: Diagnosis not present

## 2022-10-24 DIAGNOSIS — G44309 Post-traumatic headache, unspecified, not intractable: Secondary | ICD-10-CM | POA: Diagnosis not present

## 2022-10-24 MED ORDER — AMITRIPTYLINE HCL 10 MG PO TABS: 10.0000 mg | ORAL_TABLET | Freq: Every day | ORAL | 0 refills | Status: DC

## 2022-10-24 MED ORDER — MECLIZINE HCL 25 MG PO TABS: 25.0000 mg | ORAL_TABLET | Freq: Three times a day (TID) | ORAL | 0 refills | Status: DC | PRN

## 2022-10-24 NOTE — Progress Notes (Signed)
BP 115/72   Pulse (!) 56   Temp 98 F (36.7 C) (Oral)   Ht 5' 7.9" (1.725 m)   Wt 194 lb (88 kg)   LMP 09/28/2022   SpO2 99%   BMI 29.58 kg/m    Subjective:    Patient ID: Daisy Ross, female    DOB: 04-Nov-1975, 47 y.o.   MRN: [id number]  HPI: Daisy Ross is a 47 y.o. female  Chief Complaint  Patient presents with   ER Follow Up   Headache    6 week f/up    DIZZINESS/HEADACHES Had concussion months back after slamming into door.  Seen in ER on 06/11/22.  Continues to have vertigo and headaches from this + sharp intermittent pain to left temple -- worse at night.  She notices increase pain is drinks alcohol.    Had CT head and orbits in ER which were reassuring.   Duration: months Description of symptoms: lightheaded, if lying down the room spins Duration of episode: minutes to hours Provoking factors: none Aggravating factors:  none Triggered by rolling over in bed: yes Triggered by bending over: no Aggravated by head movement: no Aggravated by exertion, coughing, loud noises: no Recent head injury: yes Recent or current viral symptoms: no History of vasovagal episodes: no Nausea: no Vomiting: no Tinnitus: no Hearing loss: no Aural fullness: no Headache: yes Photophobia/phonophobia: no Unsteady gait: no Postural instability: no Diplopia, dysarthria, dysphagia or weakness: no Related to exertion: no Pallor: no Diaphoresis: no Dyspnea: no Chest pain: no   ER FOLLOW UP Was seen in ER yesterday for pain in RUQ -- imaging small layering of sludge in gall bladder + positive Murphy sign.  Has been having pain for months and thought it was mattress -- then the pain got worse.   Time since discharge: one day Hospital/facility: [facility] Diagnosis:  RUQ pain Procedures/tests: imaging Consultants: none New medications: Norco, Zofran, Augmentin, Bentyl Discharge instructions:  follow-up with surgery Status: fluctuating   Relevant past medical, surgical, family  and social history reviewed and updated as indicated. Interim medical history since our last visit reviewed. Allergies and medications reviewed and updated.  Review of Systems  Constitutional:  Negative for activity change, appetite change, diaphoresis, fatigue and fever.  Respiratory:  Negative for cough, chest tightness, shortness of breath and wheezing.   Cardiovascular:  Negative for chest pain, palpitations and leg swelling.  Gastrointestinal:  Positive for abdominal distention, abdominal pain, constipation and nausea. Negative for blood in stool, diarrhea and vomiting.  Neurological:  Positive for dizziness and headaches. Negative for tremors, syncope, light-headedness and numbness.  Psychiatric/Behavioral: Negative.     Per HPI unless specifically indicated above     Objective:    BP 115/72   Pulse (!) 56   Temp 98 F (36.7 C) (Oral)   Ht 5' 7.9" (1.725 m)   Wt 194 lb (88 kg)   LMP 09/28/2022   SpO2 99%   BMI 29.58 kg/m   Wt Readings from Last 3 Encounters:  10/24/22 194 lb (88 kg)  10/23/22 200 lb (90.7 kg)  09/12/22 195 lb 9.6 oz (88.7 kg)    Physical Exam Vitals and nursing note reviewed.  Constitutional:      General: She is awake. She is not in acute distress.    Appearance: She is well-developed and well-groomed. She is not ill-appearing or toxic-appearing.  HENT:     Head: Normocephalic.     Right Ear: Hearing and external ear normal.  Left Ear: Hearing and external ear normal.  Eyes:     General: Lids are normal.        Right eye: No discharge.        Left eye: No discharge.     Conjunctiva/sclera: Conjunctivae normal.     Pupils: Pupils are equal, round, and reactive to light.  Neck:     Thyroid: No thyromegaly.     Vascular: No carotid bruit.  Cardiovascular:     Rate and Rhythm: Regular rhythm. Bradycardia present.     Heart sounds: Normal heart sounds. No murmur heard.    No gallop.  Pulmonary:     Effort: Pulmonary effort is normal. No  accessory muscle usage or respiratory distress.     Breath sounds: Normal breath sounds.  Abdominal:     General: Bowel sounds are normal. There is no distension.     Palpations: Abdomen is soft. There is no hepatomegaly.     Tenderness: There is abdominal tenderness in the right upper quadrant. There is no right CVA tenderness, left CVA tenderness or guarding. Positive signs include Murphy's sign.  Musculoskeletal:     Cervical back: Normal range of motion and neck supple.     Right lower leg: No edema.     Left lower leg: No edema.  Lymphadenopathy:     Cervical: No cervical adenopathy.  Skin:    General: Skin is warm and dry.  Neurological:     Mental Status: She is alert and oriented to person, place, and time.     Cranial Nerves: Cranial nerves 2-12 are intact.     Motor: Motor function is intact.     Coordination: Coordination is intact.     Deep Tendon Reflexes: Reflexes are normal and symmetric.     Reflex Scores:      Brachioradialis reflexes are 2+ on the right side and 2+ on the left side.      Patellar reflexes are 2+ on the right side and 2+ on the left side. Psychiatric:        Attention and Perception: Attention normal.        Mood and Affect: Mood normal.        Speech: Speech normal.        Behavior: Behavior normal. Behavior is cooperative.        Thought Content: Thought content normal.    Results for orders placed or performed during the hospital encounter of 10/23/22  Comprehensive metabolic panel  Result Value Ref Range   Sodium 136 135 - 145 mmol/L   Potassium 4.2 3.5 - 5.1 mmol/L   Chloride 103 98 - 111 mmol/L   CO2 23 22 - 32 mmol/L   Glucose, Bld 130 (H) 70 - 99 mg/dL   BUN 12 6 - 20 mg/dL   Creatinine, Ser 8.29 0.44 - 1.00 mg/dL   Calcium 9.1 8.9 - 56.2 mg/dL   Total Protein 8.0 6.5 - 8.1 g/dL   Albumin 4.1 3.5 - 5.0 g/dL   AST 57 (H) 15 - 41 U/L   ALT 62 (H) 0 - 44 U/L   Alkaline Phosphatase 64 38 - 126 U/L   Total Bilirubin 0.5 0.3 - 1.2  mg/dL   GFR, Estimated >13 >08 mL/min   Anion gap 10 5 - 15  Lipase, blood  Result Value Ref Range   Lipase 27 11 - 51 U/L  CBC with Differential  Result Value Ref Range   WBC 14.7 (H) 4.0 - 10.5  K/uL   RBC 4.82 3.87 - 5.11 MIL/uL   Hemoglobin 12.1 12.0 - 15.0 g/dL   HCT 62.9 52.8 - 41.3 %   MCV 81.3 80.0 - 100.0 fL   MCH 25.1 (L) 26.0 - 34.0 pg   MCHC 30.9 30.0 - 36.0 g/dL   RDW 24.4 (H) 01.0 - 27.2 %   Platelets 310 150 - 400 K/uL   nRBC 0.0 0.0 - 0.2 %   Neutrophils Relative % 84 %   Neutro Abs 12.2 (H) 1.7 - 7.7 K/uL   Lymphocytes Relative 8 %   Lymphs Abs 1.2 0.7 - 4.0 K/uL   Monocytes Relative 8 %   Monocytes Absolute 1.1 (H) 0.1 - 1.0 K/uL   Eosinophils Relative 0 %   Eosinophils Absolute 0.0 0.0 - 0.5 K/uL   Basophils Relative 0 %   Basophils Absolute 0.1 0.0 - 0.1 K/uL   Immature Granulocytes 0 %   Abs Immature Granulocytes 0.05 0.00 - 0.07 K/uL  Urinalysis, Routine w reflex microscopic -Urine, Clean Catch  Result Value Ref Range   Color, Urine STRAW (A) YELLOW   APPearance CLEAR (A) CLEAR   Specific Gravity, Urine 1.009 1.005 - 1.030   pH 6.0 5.0 - 8.0   Glucose, UA NEGATIVE NEGATIVE mg/dL   Hgb urine dipstick NEGATIVE NEGATIVE   Bilirubin Urine NEGATIVE NEGATIVE   Ketones, ur NEGATIVE NEGATIVE mg/dL   Protein, ur NEGATIVE NEGATIVE mg/dL   Nitrite NEGATIVE NEGATIVE   Leukocytes,Ua NEGATIVE NEGATIVE  POC urine preg, ED  Result Value Ref Range   Preg Test, Ur Negative Negative      Assessment & Plan:   Problem List Items Addressed This Visit       Other   Gall bladder pain    With sludge noticed on imaging 10/23/22.  Will place urgent referral to general surgery, alerted referral team.  She has medications from ER to pick-up, but one is not available at pharmacy, she will alert PCP to which one and will send elsewhere if needed.        Relevant Orders   Ambulatory referral to General Surgery   Post-concussion headache - Primary    Ongoing headache to  left temple and vertigo since concussion on 06/11/22, overall imaging in ER reassuring.  Will start Amitriptyline 10 MG nightly and Meclizine as needed -- this may assist since headaches mainly at night.  Educated her on this regimen.  May use Tylenol or Ibuprofen as home as needed.  Neuro exam stable.      Relevant Medications   amitriptyline (ELAVIL) 10 MG tablet      Follow up plan: Return in about 7 weeks (around 12/14/2022) for T2DM, HEADACHES, HLD.

## 2022-10-24 NOTE — Assessment & Plan Note (Signed)
With sludge noticed on imaging 10/23/22.  Will place urgent referral to general surgery, alerted referral team.  She has medications from ER to pick-up, but one is not available at pharmacy, she will alert PCP to which one and will send elsewhere if needed.

## 2022-10-24 NOTE — Assessment & Plan Note (Signed)
Ongoing headache to left temple and vertigo since concussion on 06/11/22, overall imaging in ER reassuring.  Will start Amitriptyline 10 MG nightly and Meclizine as needed -- this may assist since headaches mainly at night.  Educated her on this regimen.  May use Tylenol or Ibuprofen as home as needed.  Neuro exam stable.

## 2022-10-24 NOTE — Group Note (Deleted)

## 2022-10-28 ENCOUNTER — Other Ambulatory Visit: Payer: Self-pay | Admitting: Nurse Practitioner

## 2022-10-28 NOTE — Telephone Encounter (Signed)
Reordered 08/01/22 #[id number]  Requested Prescriptions  Refused Prescriptions Disp Refills   metFORMIN (GLUCOPHAGE) 500 MG tablet [Pharmacy Med Name: METFORMIN HCL 500 MG TABLET] 180 tablet 1    Sig: TAKE 1 TABLET BY MOUTH 2 TIMES DAILY WITH A MEAL.     Endocrinology:  Diabetes - Biguanides Failed - 10/28/2022  2:46 AM      Failed - CBC within normal limits and completed in the last 12 months    WBC  Date Value Ref Range Status  10/23/2022 14.7 (H) 4.0 - 10.5 K/uL Final   RBC  Date Value Ref Range Status  10/23/2022 4.82 3.87 - 5.11 MIL/uL Final   Hemoglobin  Date Value Ref Range Status  10/23/2022 12.1 12.0 - 15.0 g/dL Final  78/29/5621 30.8 11.1 - 15.9 g/dL Final   HCT  Date Value Ref Range Status  10/23/2022 39.2 36.0 - 46.0 % Final   Hematocrit  Date Value Ref Range Status  09/12/2022 36.1 34.0 - 46.6 % Final   MCHC  Date Value Ref Range Status  10/23/2022 30.9 30.0 - 36.0 g/dL Final   [facility]  Date Value Ref Range Status  10/23/2022 25.1 (L) 26.0 - 34.0 pg Final   MCV  Date Value Ref Range Status  10/23/2022 81.3 80.0 - 100.0 fL Final  09/12/2022 79 79 - 97 fL Final  01/02/2014 91 80 - 100 fL Final   No results found for: "PLTCOUNTKUC", "LABPLAT", "POCPLA" RDW  Date Value Ref Range Status  10/23/2022 16.3 (H) 11.5 - 15.5 % Final  09/12/2022 15.4 11.7 - 15.4 % Final  01/02/2014 12.7 11.5 - 14.5 % Final         Passed - Cr in normal range and within 360 days    Creatinine  Date Value Ref Range Status  01/02/2014 0.75 0.60 - 1.30 mg/dL Final   Creatinine, Ser  Date Value Ref Range Status  10/23/2022 0.63 0.44 - 1.00 mg/dL Final         Passed - HBA1C is between 0 and 7.9 and within 180 days    Hemoglobin A1C  Date Value Ref Range Status  06/02/2022 7.3  Final   HB A1C (BAYER DCA - WAIVED)  Date Value Ref Range Status  09/12/2022 7.2 (H) 4.8 - 5.6 % Final    Comment:             Prediabetes: 5.7 - 6.4          Diabetes: >6.4          Glycemic  control for adults with diabetes: <7.0          Passed - eGFR in normal range and within 360 days    EGFR (African American)  Date Value Ref Range Status  01/02/2014 >60 >7mL/min Final  03/06/2012 >60  Final   GFR calc Af Amer  Date Value Ref Range Status  05/21/2019 >60 >60 mL/min Final   EGFR (Non-African Amer.)  Date Value Ref Range Status  01/02/2014 >60 >62mL/min Final    Comment:    eGFR values <62mL/min/1.73 m2 may be an indication of chronic kidney disease (CKD). Calculated eGFR, using the MRDR Study equation, is useful in  patients with stable renal function. The eGFR calculation will not be reliable in acutely ill patients when serum creatinine is changing rapidly. It is not useful in patients on dialysis. The eGFR calculation may not be applicable to patients at the low and high extremes of body sizes, pregnant women,  and vegetarians.   03/06/2012 >60  Final    Comment:    eGFR values <42mL/min/1.73 m2 may be an indication of chronic kidney disease (CKD). Calculated eGFR is useful in patients with stable renal function. The eGFR calculation will not be reliable in acutely ill patients when serum creatinine is changing rapidly. It is not useful in  patients on dialysis. The eGFR calculation may not be applicable to patients at the low and high extremes of body sizes, pregnant women, and vegetarians.    GFR, Estimated  Date Value Ref Range Status  10/23/2022 >60 >60 mL/min Final    Comment:    (NOTE) Calculated using the CKD-EPI Creatinine Equation (2021)    eGFR  Date Value Ref Range Status  09/12/2022 108 >59 mL/min/1.73 Final         Passed - B12 Level in normal range and within 720 days    Vitamin B-12  Date Value Ref Range Status  09/12/2022 489 232 - 1,245 pg/mL Final         Passed - Valid encounter within last 6 months    Recent Outpatient Visits           4 days ago Post-concussion headache   [facility]  [place], Poole T, NP   1 month ago Type 2 diabetes mellitus with hyperglycemia, without long-term current use of insulin (HCC)   [place] [facility] [place], [place] T, NP   2 months ago Type 2 diabetes mellitus with hyperglycemia, without long-term current use of insulin (HCC)   [place] [facility] [place], Dorie Rank, NP       Future Appointments             In 1 month [place], Dorie Rank, NP [place] [facility]

## 2022-10-31 ENCOUNTER — Encounter: Payer: Self-pay | Admitting: Surgery

## 2022-10-31 ENCOUNTER — Ambulatory Visit (INDEPENDENT_AMBULATORY_CARE_PROVIDER_SITE_OTHER): Payer: Commercial Managed Care - PPO | Admitting: Surgery

## 2022-10-31 VITALS — BP 126/84 | HR 54 | Temp 98.6°F | Ht 67.0 in | Wt 194.0 lb

## 2022-10-31 DIAGNOSIS — K811 Chronic cholecystitis: Secondary | ICD-10-CM | POA: Diagnosis not present

## 2022-10-31 DIAGNOSIS — M792 Neuralgia and neuritis, unspecified: Secondary | ICD-10-CM
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
## 2022-10-31 NOTE — Patient Instructions (Signed)
Our surgery scheduler Britta Mccreedy will call you within 24-48 hours to get you scheduled. If you have not heard from her after 48 hours, please call our office. Have the blue sheet available when she calls to write down important information.   If you have any concerns or questions, please feel free to call our office.   Minimally Invasive Cholecystectomy Minimally invasive cholecystectomy is surgery to remove the gallbladder. The gallbladder is a pear-shaped organ that lies beneath the liver on the right side of the body. The gallbladder stores bile, which is a fluid that helps the body digest fats. Cholecystectomy is often done to treat inflammation (irritation and swelling) of the gallbladder (cholecystitis). This condition is usually caused by a buildup of gallstones (cholelithiasis) in the gallbladder or when the fluid in the gall bladder becomes stagnant because gallstones get stuck in the ducts (tubes) and block the flow of bile. This can result in inflammation and pain. In severe cases, emergency surgery may be required. This procedure is done through small incisions in the abdomen, instead of one large incision. It is also called laparoscopic surgery. A thin scope with a camera (laparoscope) is inserted through one incision. Then surgical instruments are inserted through the other incisions. In some cases, a minimally invasive surgery may need to be changed to a surgery that is done through a larger incision. This is called open surgery. Tell a health care provider about: Any allergies you have. All medicines you are taking, including vitamins, herbs, eye drops, creams, and over-the-counter medicines. Any problems you or family members have had with anesthetic medicines. Any bleeding problems you have. Any surgeries you have had. Any medical conditions you have. Whether you are pregnant or may be pregnant. What are the risks? Generally, this is a safe procedure. However, problems may occur,  including: Infection. Bleeding. Allergic reactions to medicines. Damage to nearby structures or organs. A gallstone remaining in the common bile duct. The common bile duct carries bile from the gallbladder to the small intestine. A bile leak from the liver or cystic duct after your gallbladder is removed. What happens before the procedure? When to stop eating and drinking Follow instructions from your health care provider about what you may eat and drink before your procedure. These may include: 8 hours before the procedure Stop eating most foods. Do not eat meat, fried foods, or fatty foods. Eat only light foods, such as toast or crackers. All liquids are okay except energy drinks and alcohol. 6 hours before the procedure Stop eating. Drink only clear liquids, such as water, clear fruit juice, black coffee, plain tea, and sports drinks. Do not drink energy drinks or alcohol. 2 hours before the procedure Stop drinking all liquids. You may be allowed to take medicines with small sips of water. If you do not follow your health care provider's instructions, your procedure may be delayed or canceled. Medicines Ask your health care provider about: Changing or stopping your regular medicines. This is especially important if you are taking diabetes medicines or blood thinners. Taking medicines such as aspirin and ibuprofen. These medicines can thin your blood. Do not take these medicines unless your health care provider tells you to take them. Taking over-the-counter medicines, vitamins, herbs, and supplements. General instructions If you will be going home right after the procedure, plan to have a responsible adult: Take you home from the hospital or clinic. You will not be allowed to drive. Care for you for the time you are told. Do  not use any products that contain nicotine or tobacco for at least 4 weeks before the procedure. These products include cigarettes, chewing tobacco, and vaping  devices, such as e-cigarettes. If you need help quitting, ask your health care provider. Ask your health care provider: How your surgery site will be marked. What steps will be taken to help prevent infection. These may include: Removing hair at the surgery site. Washing skin with a germ-killing soap. Taking antibiotic medicine. What happens during the procedure?  An IV will be inserted into one of your veins. You will be given one or both of the following: A medicine to help you relax (sedative). A medicine to make you fall asleep (general anesthetic). Your surgeon will make several small incisions in your abdomen. The laparoscope will be inserted through one of the small incisions. The camera on the laparoscope will send images to a monitor in the operating room. This lets your surgeon see inside your abdomen. A gas will be pumped into your abdomen. This will expand your abdomen to give the surgeon more room to perform the surgery. Other tools that are needed for the procedure will be inserted through the other incisions. The gallbladder will be removed through one of the incisions. Your common bile duct may be examined. If stones are found in the common bile duct, they may be removed. After your gallbladder has been removed, the incisions will be closed with stitches (sutures), staples, or skin glue. Your incisions will be covered with a bandage (dressing). The procedure may vary among health care providers and hospitals. What happens after the procedure? Your blood pressure, heart rate, breathing rate, and blood oxygen level will be monitored until you leave the hospital or clinic. You will be given medicines as needed to control your pain. You may have a drain placed in the incision. The drain will be removed a day or two after the procedure. Summary Minimally invasive cholecystectomy, also called laparoscopic cholecystectomy, is surgery to remove the gallbladder using small  incisions. Tell your health care provider about all the medical conditions you have and all the medicines you are taking for those conditions. Before the procedure, follow instructions about when to stop eating and drinking and changing or stopping medicines. Plan to have a responsible adult care for you for the time you are told after you leave the hospital or clinic. This information is not intended to replace advice given to you by your health care provider. Make sure you discuss any questions you have with your health care provider. Document Revised: 08/04/2020 Document Reviewed: 08/04/2020 Elsevier Patient Education  2024 [employer].
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
## 2022-11-01 ENCOUNTER — Telehealth: Payer: Self-pay | Admitting: Surgery

## 2022-11-01 NOTE — Telephone Encounter (Signed)
Left message for patient to call, please inform her of the following regarding scheduled surgery with Dr. Everlene Farrier.   Pre-Admission date/time, and Surgery date at [facility].  Surgery Date: 11/10/22 Preadmission Testing Date: 11/04/22 (phone 8a-1p)  Also patient will need to call at [phone], between 1-3:00pm the day before surgery, to find out what time to arrive for surgery.

## 2022-11-01 NOTE — Telephone Encounter (Signed)
Patient calls back, she is informed of all dates regarding surgery.

## 2022-11-04 ENCOUNTER — Other Ambulatory Visit: Payer: Commercial Managed Care - PPO

## 2022-11-04 ENCOUNTER — Inpatient Hospital Stay
Admission: RE | Admit: 2022-11-04 | Discharge: 2022-11-04 | Disposition: A | Discharge status: Home / Self Care | Payer: Commercial Managed Care - PPO | Source: Ambulatory Visit

## 2022-11-04 HISTORY — DX: COVID-19: U07.1

## 2022-11-04 HISTORY — DX: Bradycardia, unspecified: R00.1

## 2022-11-04 HISTORY — DX: Unspecified osteoarthritis, unspecified site: M19.90

## 2022-11-04 HISTORY — DX: Anemia, unspecified: D64.9

## 2022-11-04 HISTORY — DX: Type 2 diabetes mellitus without complications: E11.9

## 2022-11-04 NOTE — Patient Instructions (Signed)
Your procedure is scheduled on: 11/10/22 - Thursday Report to the Registration Desk on the 1st floor of the [facility]. To find out your arrival time, please call [phone] between 1PM - 3PM on: 11/09/22 - Wednesday If your arrival time is 6:00 am, do not arrive before that time as the [facility] entrance doors do not open until 6:00 am.  REMEMBER: Instructions that are not followed completely may result in serious medical risk, up to and including death; or upon the discretion of your surgeon and anesthesiologist your surgery may need to be rescheduled.  Do not eat food after midnight the night before surgery.  No gum chewing or hard candies.  You may however, drink CLEAR liquids up to 2 hours before you are scheduled to arrive for your surgery. Do not drink anything within 2 hours of your scheduled arrival time.  Clear liquids include: - water  - apple juice without pulp - gatorade (not RED colors) - black coffee or tea (Do NOT add milk or creamers to the coffee or tea) Do NOT drink anything that is not on this list.  One week prior to surgery: Stop Anti-inflammatories (NSAIDS) such as Advil, Aleve, Ibuprofen, Motrin, Naproxen, Naprosyn and Aspirin based products such as Excedrin, Goody's Powder, BC Powder. You may however, continue to take Tylenol if needed for pain up until the day of surgery.  Stop ANY OVER THE COUNTER supplements until after surgery.  Continue taking all prescribed medications with the exception of the following:  - metFORMIN (GLUCOPHAGE) hold beginning 11/08/22.   TAKE ONLY THESE MEDICATIONS THE MORNING OF SURGERY WITH A SIP OF WATER:  NONE   No Alcohol for 24 hours before or after surgery.  No Smoking including e-cigarettes for 24 hours before surgery.  No chewable tobacco products for at least 6 hours before surgery.  No nicotine patches on the day of surgery.  Do not use any "recreational" drugs for at least a week (preferably 2 weeks)  before your surgery.  Please be advised that the combination of cocaine and anesthesia may have negative outcomes, up to and including death. If you test positive for cocaine, your surgery will be cancelled.  On the morning of surgery brush your teeth with toothpaste and water, you may rinse your mouth with mouthwash if you wish. Do not swallow any toothpaste or mouthwash.  Use CHG Soap or wipes as directed on instruction sheet.  Do not wear jewelry, make-up, hairpins, clips or nail polish.  For welded (permanent) jewelry: bracelets, anklets, waist bands, etc.  Please have this removed prior to surgery.  If it is not removed, there is a chance that hospital personnel will need to cut it off on the day of surgery.  Do not wear lotions, powders, or perfumes.   Do not shave body hair from the neck down 48 hours before surgery.  Contact lenses, hearing aids and dentures may not be worn into surgery.  Do not bring valuables to the hospital. [facility] is not responsible for any missing/lost belongings or valuables.   Notify your doctor if there is any change in your medical condition (cold, fever, infection).  Wear comfortable clothing (specific to your surgery type) to the hospital.  After surgery, you can help prevent lung complications by doing breathing exercises.  Take deep breaths and cough every 1-2 hours. Your doctor may order a device called an Incentive Spirometer to help you take deep breaths. When coughing or sneezing, hold a pillow firmly against  your incision with both hands. This is called "splinting." Doing this helps protect your incision. It also decreases belly discomfort.  If you are being admitted to the hospital overnight, leave your suitcase in the car. After surgery it may be brought to your room.  In case of increased patient census, it may be necessary for you, the patient, to continue your postoperative care in the Same Day Surgery department.  If you are being  discharged the day of surgery, you will not be allowed to drive home. You will need a responsible individual to drive you home and stay with you for 24 hours after surgery.   If you are taking public transportation, you will need to have a responsible individual with you.  Please call the Pre-admissions Testing Dept. at [phone] if you have any questions about these instructions.  Surgery Visitation Policy:  Patients having surgery or a procedure may have two visitors.  Children under the age of 42 must have an adult with them who is not the patient.  Inpatient Visitation:    Visiting hours are 7 a.m. to 8 p.m. Up to four visitors are allowed at one time in a patient room. The visitors may rotate out with other people during the day.  One visitor age 32 or older may stay with the patient overnight and must be in the room by 8 p.m.    Preparing for Surgery with CHLORHEXIDINE GLUCONATE (CHG) Soap  Chlorhexidine Gluconate (CHG) Soap  o An antiseptic cleaner that kills germs and bonds with the skin to continue killing germs even after washing  o Used for showering the night before surgery and morning of surgery  Before surgery, you can play an important role by reducing the number of germs on your skin.  CHG (Chlorhexidine gluconate) soap is an antiseptic cleanser which kills germs and bonds with the skin to continue killing germs even after washing.  Please do not use if you have an allergy to CHG or antibacterial soaps. If your skin becomes reddened/irritated stop using the CHG.  1. Shower the NIGHT BEFORE SURGERY and the MORNING OF SURGERY with CHG soap.  2. If you choose to wash your hair, wash your hair first as usual with your normal shampoo.  3. After shampooing, rinse your hair and body thoroughly to remove the shampoo.  4. Use CHG as you would any other liquid soap. You can apply CHG directly to the skin and wash gently with a scrungie or a clean washcloth.  5.  Apply the CHG soap to your body only from the neck down. Do not use on open wounds or open sores. Avoid contact with your eyes, ears, mouth, and genitals (private parts). Wash face and genitals (private parts) with your normal soap.  6. Wash thoroughly, paying special attention to the area where your surgery will be performed.  7. Thoroughly rinse your body with warm water.  8. Do not shower/wash with your normal soap after using and rinsing off the CHG soap.  9. Pat yourself dry with a clean towel.  10. Wear clean pajamas to bed the night before surgery.  12. Place clean sheets on your bed the night of your first shower and do not sleep with pets.  13. Shower again with the CHG soap on the day of surgery prior to arriving at the hospital.  14. Do not apply any deodorants/lotions/powders.  15. Please wear clean clothes to the hospital.

## 2022-11-07 ENCOUNTER — Encounter
Admission: RE | Admit: 2022-11-07 | Discharge: 2022-11-07 | Disposition: A | Discharge status: Home / Self Care | Payer: Commercial Managed Care - PPO | Source: Ambulatory Visit | Attending: Surgery | Admitting: Surgery

## 2022-11-07 ENCOUNTER — Other Ambulatory Visit: Payer: Self-pay

## 2022-11-07 VITALS — Ht 67.0 in | Wt 194.0 lb

## 2022-11-07 DIAGNOSIS — E119 Type 2 diabetes mellitus without complications: Secondary | ICD-10-CM

## 2022-11-07 DIAGNOSIS — Z01812 Encounter for preprocedural laboratory examination: Secondary | ICD-10-CM

## 2022-11-07 DIAGNOSIS — R001 Bradycardia, unspecified: Secondary | ICD-10-CM

## 2022-11-07 HISTORY — DX: Polyp of colon: K63.5

## 2022-11-07 HISTORY — DX: Diverticulosis of intestine, part unspecified, without perforation or abscess without bleeding: K57.90

## 2022-11-07 NOTE — Patient Instructions (Addendum)
Your procedure is scheduled on: Thursday, September 26 Report to the Registration Desk on the 1st floor of the [facility] To find out your arrival time, please call [phone] between 1PM - 3PM on: Wednesday, September 25 If your arrival time is 6:00 am, do not arrive before that time as the [facility] entrance doors do not open until 6:00 am.  REMEMBER: Instructions that are not followed completely may result in serious medical risk, up to and including death; or upon the discretion of your surgeon and anesthesiologist your surgery may need to be rescheduled.  Do not eat food after midnight the night before surgery.  No gum chewing or hard candies.  You may however, drink water up to 2 hours before you are scheduled to arrive for your surgery. Do not drink anything within 2 hours of your scheduled arrival time.  One week prior to surgery: starting today, September 23 Stop Anti-inflammatories (NSAIDS) such as Advil, Aleve, Ibuprofen, Motrin, Naproxen, Naprosyn and Aspirin based products such as Excedrin, Goody's Powder, BC Powder. Stop ANY OVER THE COUNTER supplements until after surgery. You may however, continue to take Tylenol if needed for pain up until the day of surgery.  Continue taking all prescribed medications with the exception of the following:  Metformin - hold for 2 days before surgery. Last day to take metformin is Monday, September 23. Resume AFTER surgery.  DO NOT TAKE ANY MEDICATIONS THE MORNING OF SURGERY   No Alcohol for 24 hours before or after surgery.  No Smoking including e-cigarettes for 24 hours before surgery.  No chewable tobacco products for at least 6 hours before surgery.  No nicotine patches on the day of surgery.  Do not use any "recreational" drugs for at least a week (preferably 2 weeks) before your surgery.  Please be advised that the combination of cocaine and anesthesia may have negative outcomes, up to and including death. If you test  positive for cocaine, your surgery will be cancelled.  On the morning of surgery brush your teeth with toothpaste and water, you may rinse your mouth with mouthwash if you wish. Do not swallow any toothpaste or mouthwash.  Use CHG Soap as directed on instruction sheet.  Do not wear jewelry, make-up, hairpins, clips or nail polish.  For welded (permanent) jewelry: bracelets, anklets, waist bands, etc.  Please have this removed prior to surgery.  If it is not removed, there is a chance that hospital personnel will need to cut it off on the day of surgery.  Do not wear lotions, powders, or perfumes.   Do not shave body hair from the neck down 48 hours before surgery.  Contact lenses, hearing aids and dentures may not be worn into surgery.  Do not bring valuables to the hospital. [facility] is not responsible for any missing/lost belongings or valuables.   Notify your doctor if there is any change in your medical condition (cold, fever, infection).  Wear comfortable clothing (specific to your surgery type) to the hospital.  After surgery, you can help prevent lung complications by doing breathing exercises.  Take deep breaths and cough every 1-2 hours.   If you are being discharged the day of surgery, you will not be allowed to drive home. You will need a responsible individual to drive you home and stay with you for 24 hours after surgery.   If you are taking public transportation, you will need to have a responsible individual with you.  Please call the Pre-admissions  Testing Dept. at [phone] if you have any questions about these instructions.  Surgery Visitation Policy:  Patients having surgery or a procedure may have two visitors.  Children under the age of 57 must have an adult with them who is not the patient.     Preparing for Surgery with CHLORHEXIDINE GLUCONATE (CHG) Soap  Chlorhexidine Gluconate (CHG) Soap  o An antiseptic cleaner that kills germs and bonds  with the skin to continue killing germs even after washing  o Used for showering the night before surgery and morning of surgery  Before surgery, you can play an important role by reducing the number of germs on your skin.  CHG (Chlorhexidine gluconate) soap is an antiseptic cleanser which kills germs and bonds with the skin to continue killing germs even after washing.  Please do not use if you have an allergy to CHG or antibacterial soaps. If your skin becomes reddened/irritated stop using the CHG.  1. Shower the NIGHT BEFORE SURGERY and the MORNING OF SURGERY with CHG soap.  2. If you choose to wash your hair, wash your hair first as usual with your normal shampoo.  3. After shampooing, rinse your hair and body thoroughly to remove the shampoo.  4. Use CHG as you would any other liquid soap. You can apply CHG directly to the skin and wash gently with a scrungie or a clean washcloth.  5. Apply the CHG soap to your body only from the neck down. Do not use on open wounds or open sores. Avoid contact with your eyes, ears, mouth, and genitals (private parts). Wash face and genitals (private parts) with your normal soap.  6. Wash thoroughly, paying special attention to the area where your surgery will be performed.  7. Thoroughly rinse your body with warm water.  8. Do not shower/wash with your normal soap after using and rinsing off the CHG soap.  9. Pat yourself dry with a clean towel.  10. Wear clean pajamas to bed the night before surgery.  12. Place clean sheets on your bed the night of your first shower and do not sleep with pets.  13. Shower again with the CHG soap on the day of surgery prior to arriving at the hospital.  14. Do not apply any deodorants/lotions/powders.  15. Please wear clean clothes to the hospital.

## 2022-11-08 ENCOUNTER — Encounter
Admission: RE | Admit: 2022-11-08 | Discharge: 2022-11-08 | Disposition: A | Discharge status: Home / Self Care | Payer: Commercial Managed Care - PPO | Source: Ambulatory Visit | Attending: Surgery | Admitting: Surgery

## 2022-11-08 DIAGNOSIS — R001 Bradycardia, unspecified: Secondary | ICD-10-CM | POA: Diagnosis not present

## 2022-11-08 DIAGNOSIS — Z01810 Encounter for preprocedural cardiovascular examination: Secondary | ICD-10-CM | POA: Diagnosis not present

## 2022-11-08 DIAGNOSIS — Z01812 Encounter for preprocedural laboratory examination: Secondary | ICD-10-CM

## 2022-11-08 DIAGNOSIS — E119 Type 2 diabetes mellitus without complications: Secondary | ICD-10-CM | POA: Diagnosis not present

## 2022-11-08 DIAGNOSIS — Z01818 Encounter for other preprocedural examination: Secondary | ICD-10-CM | POA: Diagnosis present

## 2022-11-09 MED ORDER — ORAL CARE MOUTH RINSE: 15.0000 mL | Freq: Once | OROMUCOSAL | Status: AC

## 2022-11-09 MED ORDER — CHLORHEXIDINE GLUCONATE CLOTH 2 % EX PADS: 6.0000 | MEDICATED_PAD | Freq: Once | CUTANEOUS | Status: DC

## 2022-11-09 MED ORDER — CELECOXIB 200 MG PO CAPS
200.0000 mg | ORAL_CAPSULE | ORAL | Status: AC
  Administered 2022-11-10: 200 mg via ORAL

## 2022-11-09 MED ORDER — INDOCYANINE GREEN 25 MG IV SOLR
2.5000 mg | Freq: Once | INTRAVENOUS | Status: AC
  Administered 2022-11-10: 2.5 mg via INTRAVENOUS

## 2022-11-09 MED ORDER — CEFAZOLIN SODIUM-DEXTROSE 2-4 GM/100ML-% IV SOLN
2.0000 g | INTRAVENOUS | Status: AC
  Administered 2022-11-10: 2 g via INTRAVENOUS

## 2022-11-09 MED ORDER — FAMOTIDINE 20 MG PO TABS
20.0000 mg | ORAL_TABLET | Freq: Once | ORAL | Status: AC
  Administered 2022-11-10: 20 mg via ORAL

## 2022-11-09 MED ORDER — CHLORHEXIDINE GLUCONATE CLOTH 2 % EX PADS
6.0000 | MEDICATED_PAD | Freq: Once | CUTANEOUS | Status: AC
  Administered 2022-11-10: 6 via TOPICAL

## 2022-11-09 MED ORDER — CHLORHEXIDINE GLUCONATE 0.12 % MT SOLN
15.0000 mL | Freq: Once | OROMUCOSAL | Status: AC
  Administered 2022-11-10: 15 mL via OROMUCOSAL

## 2022-11-09 MED ORDER — ACETAMINOPHEN 500 MG PO TABS
1000.0000 mg | ORAL_TABLET | ORAL | Status: AC
  Administered 2022-11-10: 1000 mg via ORAL

## 2022-11-09 MED ORDER — SODIUM CHLORIDE 0.9 % IV SOLN: INTRAVENOUS | Status: DC

## 2022-11-09 MED ORDER — GABAPENTIN 300 MG PO CAPS
300.0000 mg | ORAL_CAPSULE | ORAL | Status: AC
  Administered 2022-11-10: 300 mg via ORAL

## 2022-11-10 ENCOUNTER — Other Ambulatory Visit: Payer: Self-pay

## 2022-11-10 ENCOUNTER — Ambulatory Visit
Admission: RE | Admit: 2022-11-10 | Discharge: 2022-11-10 | Disposition: A | Discharge status: Home / Self Care | Payer: Commercial Managed Care - PPO | Attending: Surgery | Admitting: Surgery

## 2022-11-10 ENCOUNTER — Ambulatory Visit: Payer: Commercial Managed Care - PPO | Admitting: Urgent Care

## 2022-11-10 ENCOUNTER — Ambulatory Visit: Payer: Commercial Managed Care - PPO | Admitting: Certified Registered"

## 2022-11-10 ENCOUNTER — Encounter: Payer: Self-pay | Admitting: Surgery

## 2022-11-10 ENCOUNTER — Encounter
Admission: RE | Disposition: A | Discharge status: Home / Self Care | Payer: Self-pay | Source: Home / Self Care | Attending: Surgery

## 2022-11-10 DIAGNOSIS — G8929 Other chronic pain: Secondary | ICD-10-CM | POA: Diagnosis not present

## 2022-11-10 DIAGNOSIS — K811 Chronic cholecystitis: Secondary | ICD-10-CM | POA: Diagnosis not present

## 2022-11-10 DIAGNOSIS — K8044 Calculus of bile duct with chronic cholecystitis without obstruction: Secondary | ICD-10-CM | POA: Insufficient documentation

## 2022-11-10 DIAGNOSIS — Z9049 Acquired absence of other specified parts of digestive tract: Secondary | ICD-10-CM

## 2022-11-10 DIAGNOSIS — Z01812 Encounter for preprocedural laboratory examination: Secondary | ICD-10-CM

## 2022-11-10 DIAGNOSIS — Z7984 Long term (current) use of oral hypoglycemic drugs: Secondary | ICD-10-CM | POA: Diagnosis not present

## 2022-11-10 DIAGNOSIS — E119 Type 2 diabetes mellitus without complications: Secondary | ICD-10-CM | POA: Insufficient documentation

## 2022-11-10 DIAGNOSIS — K8050 Calculus of bile duct without cholangitis or cholecystitis without obstruction: Secondary | ICD-10-CM | POA: Diagnosis present

## 2022-11-10 DIAGNOSIS — Z833 Family history of diabetes mellitus: Secondary | ICD-10-CM | POA: Insufficient documentation

## 2022-11-10 LAB — GLUCOSE, CAPILLARY
Glucose-Capillary: 116 mg/dL — ABNORMAL HIGH (ref 70–99)
Glucose-Capillary: 191 mg/dL — ABNORMAL HIGH (ref 70–99)

## 2022-11-10 LAB — POCT PREGNANCY, URINE: Preg Test, Ur: NEGATIVE

## 2022-11-10 SURGERY — CHOLECYSTECTOMY, ROBOT-ASSISTED, LAPAROSCOPIC
Anesthesia: General | Site: Abdomen

## 2022-11-10 MED ORDER — CELECOXIB 200 MG PO CAPS
ORAL_CAPSULE | ORAL | Status: AC
  Filled 2022-11-10: qty 1

## 2022-11-10 MED ORDER — OXYCODONE HCL 5 MG PO TABS
5.0000 mg | ORAL_TABLET | ORAL | Status: AC | PRN
  Administered 2022-11-10: 5 mg via ORAL

## 2022-11-10 MED ORDER — SUCCINYLCHOLINE CHLORIDE 200 MG/10ML IV SOSY
PREFILLED_SYRINGE | INTRAVENOUS | Status: DC | PRN
  Administered 2022-11-10: 100 mg via INTRAVENOUS

## 2022-11-10 MED ORDER — BUPIVACAINE LIPOSOME 1.3 % IJ SUSP
INTRAMUSCULAR | Status: DC | PRN
  Administered 2022-11-10: 20 mL

## 2022-11-10 MED ORDER — BUPIVACAINE-EPINEPHRINE (PF) 0.25% -1:200000 IJ SOLN
INTRAMUSCULAR | Status: DC | PRN
  Administered 2022-11-10: 30 mL

## 2022-11-10 MED ORDER — LIDOCAINE HCL (CARDIAC) PF 100 MG/5ML IV SOSY
PREFILLED_SYRINGE | INTRAVENOUS | Status: DC | PRN
  Administered 2022-11-10: 100 mg via INTRAVENOUS

## 2022-11-10 MED ORDER — PROPOFOL 10 MG/ML IV BOLUS
INTRAVENOUS | Status: DC | PRN
  Administered 2022-11-10: 200 mg via INTRAVENOUS

## 2022-11-10 MED ORDER — CEFAZOLIN SODIUM-DEXTROSE 2-4 GM/100ML-% IV SOLN
INTRAVENOUS | Status: AC
  Filled 2022-11-10: qty 100

## 2022-11-10 MED ORDER — PROPOFOL 1000 MG/100ML IV EMUL
INTRAVENOUS | Status: AC
  Filled 2022-11-10: qty 100

## 2022-11-10 MED ORDER — 0.9 % SODIUM CHLORIDE (POUR BTL) OPTIME
TOPICAL | Status: DC | PRN
  Administered 2022-11-10: 500 mL

## 2022-11-10 MED ORDER — FENTANYL CITRATE (PF) 100 MCG/2ML IJ SOLN
INTRAMUSCULAR | Status: AC
  Filled 2022-11-10: qty 2

## 2022-11-10 MED ORDER — GABAPENTIN 300 MG PO CAPS
ORAL_CAPSULE | ORAL | Status: AC
  Filled 2022-11-10: qty 1

## 2022-11-10 MED ORDER — MIDAZOLAM HCL 2 MG/2ML IJ SOLN
INTRAMUSCULAR | Status: DC | PRN
  Administered 2022-11-10: 2 mg via INTRAVENOUS

## 2022-11-10 MED ORDER — ACETAMINOPHEN 500 MG PO TABS
ORAL_TABLET | ORAL | Status: AC
  Filled 2022-11-10: qty 2

## 2022-11-10 MED ORDER — HYDROCODONE-ACETAMINOPHEN 5-325 MG PO TABS: 1.0000 | ORAL_TABLET | ORAL | 0 refills | Status: DC | PRN

## 2022-11-10 MED ORDER — ONDANSETRON HCL 4 MG/2ML IJ SOLN
INTRAMUSCULAR | Status: DC | PRN
  Administered 2022-11-10 (×2): 4 mg via INTRAVENOUS

## 2022-11-10 MED ORDER — FENTANYL CITRATE (PF) 100 MCG/2ML IJ SOLN
INTRAMUSCULAR | Status: DC | PRN
  Administered 2022-11-10 (×2): 50 ug via INTRAVENOUS

## 2022-11-10 MED ORDER — OXYCODONE HCL 5 MG PO TABS
ORAL_TABLET | ORAL | Status: AC
  Filled 2022-11-10: qty 1

## 2022-11-10 MED ORDER — GLYCOPYRROLATE 0.2 MG/ML IJ SOLN
INTRAMUSCULAR | Status: DC | PRN
  Administered 2022-11-10: .2 mg via INTRAVENOUS

## 2022-11-10 MED ORDER — DEXMEDETOMIDINE HCL IN NACL 200 MCG/50ML IV SOLN
INTRAVENOUS | Status: DC | PRN
Start: 2022-11-10 — End: 2022-11-10
  Administered 2022-11-10: 12 ug via INTRAVENOUS

## 2022-11-10 MED ORDER — INDOCYANINE GREEN 25 MG IV SOLR
INTRAVENOUS | Status: AC
  Filled 2022-11-10: qty 10

## 2022-11-10 MED ORDER — MIDAZOLAM HCL 2 MG/2ML IJ SOLN
INTRAMUSCULAR | Status: AC
  Filled 2022-11-10: qty 2

## 2022-11-10 MED ORDER — ROCURONIUM BROMIDE 100 MG/10ML IV SOLN
INTRAVENOUS | Status: DC | PRN
  Administered 2022-11-10: 50 mg via INTRAVENOUS

## 2022-11-10 MED ORDER — DROPERIDOL 2.5 MG/ML IJ SOLN: 0.6250 mg | Freq: Once | INTRAMUSCULAR | Status: DC | PRN

## 2022-11-10 MED ORDER — SUGAMMADEX SODIUM 200 MG/2ML IV SOLN
INTRAVENOUS | Status: DC | PRN
  Administered 2022-11-10: 180 mg via INTRAVENOUS

## 2022-11-10 MED ORDER — DEXAMETHASONE SODIUM PHOSPHATE 10 MG/ML IJ SOLN
INTRAMUSCULAR | Status: DC | PRN
  Administered 2022-11-10: 5 mg via INTRAVENOUS

## 2022-11-10 MED ORDER — FENTANYL CITRATE (PF) 100 MCG/2ML IJ SOLN
25.0000 ug | INTRAMUSCULAR | Status: DC | PRN
  Administered 2022-11-10 (×3): 25 ug via INTRAVENOUS

## 2022-11-10 MED ORDER — BUPIVACAINE LIPOSOME 1.3 % IJ SUSP
INTRAMUSCULAR | Status: AC
  Filled 2022-11-10: qty 20

## 2022-11-10 MED ORDER — FAMOTIDINE 20 MG PO TABS
ORAL_TABLET | ORAL | Status: AC
  Filled 2022-11-10: qty 1

## 2022-11-10 MED ORDER — CHLORHEXIDINE GLUCONATE 0.12 % MT SOLN
OROMUCOSAL | Status: AC
  Filled 2022-11-10: qty 15

## 2022-11-10 SURGICAL SUPPLY — 46 items
ADH SKN CLS APL DERMABOND .7 (GAUZE/BANDAGES/DRESSINGS) ×2
CANNULA REDUCER 12-8 DVNC XI (CANNULA) ×2 IMPLANT
CATH REDDICK CHOLANGI 4FR 50CM (CATHETERS) IMPLANT
CAUTERY HOOK MNPLR 1.6 DVNC XI (INSTRUMENTS) ×2 IMPLANT
CLIP LIGATING HEMO O LOK GREEN (MISCELLANEOUS) ×2 IMPLANT
DERMABOND ADVANCED .7 DNX12 (GAUZE/BANDAGES/DRESSINGS) ×2 IMPLANT
DRAPE ARM DVNC X/XI (DISPOSABLE) ×8 IMPLANT
DRAPE COLUMN DVNC XI (DISPOSABLE) ×2 IMPLANT
ELECT CAUTERY BLADE 6.4 (BLADE) ×2 IMPLANT
ELECT REM PT RETURN 9FT ADLT (ELECTROSURGICAL) ×2
ELECTRODE REM PT RTRN 9FT ADLT (ELECTROSURGICAL) ×2 IMPLANT
FORCEPS BPLR R/ABLATION 8 DVNC (INSTRUMENTS) ×2 IMPLANT
FORCEPS PROGRASP DVNC XI (FORCEP) ×2 IMPLANT
GLOVE BIO SURGEON STRL SZ7 (GLOVE) ×4 IMPLANT
GOWN STRL REUS W/ TWL LRG LVL3 (GOWN DISPOSABLE) ×8 IMPLANT
GOWN STRL REUS W/TWL LRG LVL3 (GOWN DISPOSABLE) ×6
IRRIGATION STRYKERFLOW (MISCELLANEOUS) IMPLANT
IRRIGATOR STRYKERFLOW (MISCELLANEOUS)
IV CATH ANGIO 12GX3 LT BLUE (NEEDLE) IMPLANT
KIT PINK PAD W/HEAD ARE REST (MISCELLANEOUS) ×2
KIT PINK PAD W/HEAD ARM REST (MISCELLANEOUS) ×2 IMPLANT
LABEL OR SOLS (LABEL) ×2 IMPLANT
MANIFOLD NEPTUNE II (INSTRUMENTS) ×2 IMPLANT
NDL HYPO 22X1.5 SAFETY MO (MISCELLANEOUS) ×2 IMPLANT
NEEDLE HYPO 22X1.5 SAFETY MO (MISCELLANEOUS) ×2 IMPLANT
NS IRRIG 500ML POUR BTL (IV SOLUTION) ×2 IMPLANT
OBTURATOR OPTICAL STND 8 DVNC (TROCAR) ×2
OBTURATOR OPTICALSTD 8 DVNC (TROCAR) ×2 IMPLANT
PACK LAP CHOLECYSTECTOMY (MISCELLANEOUS) ×2 IMPLANT
PENCIL SMOKE EVACUATOR (MISCELLANEOUS) ×2 IMPLANT
SEAL UNIV 5-12 XI (MISCELLANEOUS) ×8 IMPLANT
SET TUBE SMOKE EVAC HIGH FLOW (TUBING) ×2 IMPLANT
SOL ELECTROSURG ANTI STICK (MISCELLANEOUS) ×2
SOLUTION ELECTROSURG ANTI STCK (MISCELLANEOUS) ×2 IMPLANT
SPIKE FLUID TRANSFER (MISCELLANEOUS) ×2 IMPLANT
SPONGE T-LAP 18X18 ~~LOC~~+RFID (SPONGE) ×2 IMPLANT
SPONGE T-LAP 4X18 ~~LOC~~+RFID (SPONGE) IMPLANT
STOPCOCK 3WAY MALE LL (IV SETS) IMPLANT
SUT MNCRL AB 4-0 PS2 18 (SUTURE) ×2 IMPLANT
SUT VICRYL 0 UR6 27IN ABS (SUTURE) ×4 IMPLANT
SYR 20ML LL LF (SYRINGE) IMPLANT
SYS BAG RETRIEVAL 10MM (BASKET) ×2
SYSTEM BAG RETRIEVAL 10MM (BASKET) ×2 IMPLANT
TRAP FLUID SMOKE EVACUATOR (MISCELLANEOUS) ×2 IMPLANT
WATER STERILE IRR 3000ML UROMA (IV SOLUTION) IMPLANT
WATER STERILE IRR 500ML POUR (IV SOLUTION) ×2 IMPLANT

## 2022-11-10 NOTE — Anesthesia Preprocedure Evaluation (Signed)
Anesthesia Evaluation  Patient identified by MRN, date of birth, ID band Patient awake    Reviewed: Allergy & Precautions, H&P , NPO status , Patient's Chart, lab work & pertinent test results  History of Anesthesia Complications Negative for: history of anesthetic complications  Airway Mallampati: III  TM Distance: >3 FB Neck ROM: full    Dental  (+) Chipped, Dental Advidsory Given, Teeth Intact   Pulmonary neg pulmonary ROS, neg shortness of breath, neg recent URI   Pulmonary exam normal        Cardiovascular Exercise Tolerance: Good (-) angina (-) Past MI and (-) DOE Normal cardiovascular exam+ dysrhythmias      Neuro/Psych negative neurological ROS  negative psych ROS   GI/Hepatic Neg liver ROS,GERD  Medicated and Controlled,,  Endo/Other  diabetes    Renal/GU negative Renal ROS  negative genitourinary   Musculoskeletal   Abdominal   Peds  Hematology negative hematology ROS (+)   Anesthesia Other Findings Past Medical History: No date: GERD (gastroesophageal reflux disease) No date: History of bradycardia No date: HSV-2 (herpes simplex virus 2) infection  Past Surgical History: No date: KNEE SURGERY  BMI    Body Mass Index: 28.89 kg/m      Reproductive/Obstetrics negative OB ROS                             Anesthesia Physical Anesthesia Plan  ASA: 2  Anesthesia Plan: General   Post-op Pain Management:    Induction: Intravenous  PONV Risk Score and Plan: 3 and Ondansetron, Dexamethasone, Midazolam and Treatment may vary due to age or medical condition  Airway Management Planned: Oral ETT  Additional Equipment:   Intra-op Plan:   Post-operative Plan: Extubation in OR  Informed Consent: I have reviewed the patients History and Physical, chart, labs and discussed the procedure including the risks, benefits and alternatives for the proposed anesthesia with the  patient or authorized representative who has indicated his/her understanding and acceptance.     Dental Advisory Given  Plan Discussed with: Anesthesiologist, CRNA and Surgeon  Anesthesia Plan Comments: (Patient consented for risks of anesthesia including but not limited to:  - adverse reactions to medications - risk of intubation if required - damage to eyes, teeth, lips or other oral mucosa - nerve damage due to positioning  - sore throat or hoarseness - Damage to heart, brain, nerves, lungs, other parts of body or loss of life  Patient voiced understanding.)        Anesthesia Quick Evaluation

## 2022-11-10 NOTE — Interval H&P Note (Signed)
History and Physical Interval Note:  11/10/2022 7:26 AM  Daisy Ross  has presented today for surgery, with the diagnosis of biliary colic.  The various methods of treatment have been discussed with the patient and family. After consideration of risks, benefits and other options for treatment, the patient has consented to  Procedure(s): XI ROBOTIC ASSISTED LAPAROSCOPIC CHOLECYSTECTOMY (N/A) INDOCYANINE GREEN FLUORESCENCE IMAGING (ICG) (N/A) as a surgical intervention.  The patient's history has been reviewed, patient examined, no change in status, stable for surgery.  I have reviewed the patient's chart and labs.  Questions were answered to the patient's satisfaction.     Alanee Ting F Faria Casella

## 2022-11-10 NOTE — Op Note (Signed)
Robotic assisted laparoscopic Cholecystectomy  Pre-operative Diagnosis: Biliary colic  Post-operative Diagnosis: same  Procedure:  Robotic assisted laparoscopic Cholecystectomy  Surgeon: Sterling Big, MD FACS  Anesthesia: Gen. with endotracheal tube  Findings: Chronic mild Cholecystitis   Estimated Blood Loss: 5cc       Specimens: Gallbladder           Complications: none   Procedure Details  The patient was seen again in the Holding Room. The benefits, complications, treatment options, and expected outcomes were discussed with the patient. The risks of bleeding, infection, recurrence of symptoms, failure to resolve symptoms, bile duct damage, bile duct leak, retained common bile duct stone, bowel injury, any of which could require further surgery and/or ERCP, stent, or papillotomy were reviewed with the patient. The likelihood of improving the patient's symptoms with return to their baseline status is good.  The patient and/or family concurred with the proposed plan, giving informed consent.  The patient was taken to Operating Room, identified  and the procedure verified as Laparoscopic Cholecystectomy.  A Time Out was held and the above information confirmed.  Prior to the induction of general anesthesia, antibiotic prophylaxis was administered. VTE prophylaxis was in place. General endotracheal anesthesia was then administered and tolerated well. After the induction, the abdomen was prepped with Chloraprep and draped in the sterile fashion. The patient was positioned in the supine position.  Cut down technique was used to enter the abdominal cavity and a Hasson trochar was placed after two vicryl stitches were anchored to the fascia. Pneumoperitoneum was then created with CO2 and tolerated well without any adverse changes in the patient's vital signs.  Three 8-mm ports were placed under direct vision. All skin incisions  were infiltrated with a local anesthetic agent before making the  incision and placing the trocars.   The patient was positioned  in reverse Trendelenburg, robot was brought to the surgical field and docked in the standard fashion.  We made sure all the instrumentation was kept indirect view at all times and that there were no collision between the arms. I scrubbed out and went to the console.  The gallbladder was identified, the fundus grasped and retracted cephalad. Adhesions were lysed bluntly. The infundibulum was grasped and retracted laterally, exposing the peritoneum overlying the triangle of Calot. This was then divided and exposed in a blunt fashion. An extended critical view of the cystic duct and cystic artery was obtained.  The cystic duct was clearly identified and bluntly dissected.   Artery and duct were double clipped and divided. Using ICG cholangiography we visualize the cystic duct and w/o evidence of bile injuries. The gallbladder was taken from the gallbladder fossa in a retrograde fashion with the electrocautery.  Hemostasis was achieved with the electrocautery. nspection of the right upper quadrant was performed. No bleeding, bile duct injury or leak, or bowel injury was noted. Robotic instruments and robotic arms were undocked in the standard fashion.  I scrubbed back in.  The gallbladder was removed and placed in an Endocatch bag.   Pneumoperitoneum was released.  The periumbilical port site was closed with interrumpted 0 Vicryl sutures. 4-0 subcuticular Monocryl was used to close the skin. Dermabond was  applied.  The patient was then extubated and brought to the recovery room in stable condition. Sponge, lap, and needle counts were correct at closure and at the conclusion of the case.               Sterling Big, MD, FACS

## 2022-11-10 NOTE — Anesthesia Procedure Notes (Signed)
Procedure Name: Intubation Date/Time: 11/10/2022 7:38 AM  Performed by: Mohammed Kindle, CRNAPre-anesthesia Checklist: Patient identified, Emergency Drugs available, Suction available and Patient being monitored Patient Re-evaluated:Patient Re-evaluated prior to induction Oxygen Delivery Method: Circle system utilized Preoxygenation: Pre-oxygenation with 100% oxygen Induction Type: IV induction Ventilation: Mask ventilation without difficulty Laryngoscope Size: McGraph and 3 Grade View: Grade I Tube type: Oral Tube size: 6.5 mm Number of attempts: 1 Airway Equipment and Method: Stylet Placement Confirmation: ETT inserted through vocal cords under direct vision, positive ETCO2, breath sounds checked- equal and bilateral and CO2 detector Secured at: 21 cm Tube secured with: Tape Dental Injury: Teeth and Oropharynx as per pre-operative assessment  Difficulty Due To: Difficulty was anticipated, Difficult Airway- due to limited oral opening and Difficult Airway- due to dentition

## 2022-11-10 NOTE — Discharge Instructions (Addendum)
Laparoscopic Cholecystectomy, Care After   These instructions give you information on caring for yourself after your procedure. Your doctor may also give you more specific instructions. Call your doctor if you have any problems or questions after your procedure.  HOME CARE  Change your bandages (dressings) as told by your doctor.  Keep the wound dry and clean. Wash the wound gently with soap and water. Pat the wound dry with a clean towel.  Do not take baths, swim, or use hot tubs for 2 weeks, or as told by your doctor.  Only take medicine as told by your doctor.  Eat a normal diet as told by your doctor.  Do not lift anything heavier than 10 pounds (4.5 kg) until your doctor says it is okay.  Do not play contact sports for 1 week, or as told by your doctor. GET HELP IF:  Your wound is red, puffy (swollen), or painful.  You have yellowish-white fluid (pus) coming from the wound.  You have fluid draining from the wound for more than 1 day.  You have a bad smell coming from the wound.  Your wound breaks open. GET HELP RIGHT AWAY IF:  You have trouble breathing.  You have chest pain.  You have a fever >101  You have pain in the shoulders (shoulder strap areas) that is getting worse.  You feel dizzy or pass out (faint).  You have severe belly (abdominal) pain.  You feel sick to your stomach (nauseous) or throw up (vomit) for more than 1 day.   AMBULATORY SURGERY  DISCHARGE INSTRUCTIONS  The drugs that you were given will stay in your system until tomorrow so for the next 24 hours you should not:  Drive an automobile Make any legal decisions Drink any alcoholic beverage  You may resume regular meals tomorrow.  Today it is better to start with liquids and gradually work up to solid foods.  You may eat anything you prefer, but it is better to start with liquids, then soup and crackers, and gradually work up to solid foods.  Please notify your doctor immediately if you have any  unusual bleeding, trouble breathing, redness and pain at the surgery site, drainage, fever, or pain not relieved by medication.  Additional Instructions:  Leave teal bracelet on for 96 hours  Please contact your physician with any problems or Same Day Surgery at [phone], Monday through Friday 6 am to 4 pm, or [place] [facility] number at [phone].

## 2022-11-10 NOTE — Transfer of Care (Signed)
Immediate Anesthesia Transfer of Care Note  Patient: Daisy Ross  Procedure(s) Performed: XI ROBOTIC ASSISTED LAPAROSCOPIC CHOLECYSTECTOMY (Abdomen) INDOCYANINE GREEN FLUORESCENCE IMAGING (ICG)  Patient Location: PACU  Anesthesia Type:General  Level of Consciousness: drowsy  Airway & Oxygen Therapy: Patient Spontanous Breathing and Patient connected to face mask oxygen  Post-op Assessment: Report given to RN and Post -op Vital signs reviewed and stable  Post vital signs: Reviewed  Last Vitals:  Vitals Value Taken Time  BP 157/70 11/10/22 0849  Temp    Pulse 58 11/10/22 0850  Resp 15 11/10/22 0850  SpO2 100 % 11/10/22 0850  Vitals shown include unfiled device data.  Last Pain:         Complications:  Encounter Notable Events  Notable Event Outcome Phase Comment  Difficult to intubate - expected  Intraprocedure Filed from anesthesia note documentation.

## 2022-11-11 LAB — SURGICAL PATHOLOGY

## 2022-11-11 NOTE — Anesthesia Postprocedure Evaluation (Signed)
Anesthesia Post Note  Patient: Daisy Ross  Procedure(s) Performed: XI ROBOTIC ASSISTED LAPAROSCOPIC CHOLECYSTECTOMY (Abdomen) INDOCYANINE GREEN FLUORESCENCE IMAGING (ICG)  Patient location during evaluation: PACU Anesthesia Type: General Level of consciousness: awake and alert Pain management: pain level controlled Vital Signs Assessment: post-procedure vital signs reviewed and stable Respiratory status: spontaneous breathing, nonlabored ventilation, respiratory function stable and patient connected to nasal cannula oxygen Cardiovascular status: blood pressure returned to baseline and stable Postop Assessment: no apparent nausea or vomiting Anesthetic complications: yes   Encounter Notable Events  Notable Event Outcome Phase Comment  Difficult to intubate - expected  Intraprocedure Filed from anesthesia note documentation.     Last Vitals:  Vitals:   11/10/22 0930 11/10/22 1018  BP: 112/72 126/79  Pulse: 70 90  Resp: (!) 7 18  Temp: (!) 36.1 C   SpO2: 92% 96%    Last Pain:  Vitals:   11/10/22 1018  TempSrc:   PainSc: 4                  Lenard Simmer

## 2022-11-12 ENCOUNTER — Other Ambulatory Visit: Payer: Self-pay

## 2022-11-12 ENCOUNTER — Emergency Department
Admission: EM | Admit: 2022-11-12 | Discharge: 2022-11-12 | Disposition: A | Discharge status: Home / Self Care | Payer: Commercial Managed Care - PPO | Attending: Emergency Medicine | Admitting: Emergency Medicine

## 2022-11-12 ENCOUNTER — Emergency Department: Payer: Commercial Managed Care - PPO

## 2022-11-12 DIAGNOSIS — R1013 Epigastric pain: Secondary | ICD-10-CM | POA: Diagnosis present

## 2022-11-12 DIAGNOSIS — R1084 Generalized abdominal pain: Secondary | ICD-10-CM

## 2022-11-12 DIAGNOSIS — K5792 Diverticulitis of intestine, part unspecified, without perforation or abscess without bleeding: Secondary | ICD-10-CM | POA: Diagnosis not present

## 2022-11-12 DIAGNOSIS — D72829 Elevated white blood cell count, unspecified: Secondary | ICD-10-CM | POA: Insufficient documentation

## 2022-11-12 LAB — CBC
HCT: 37.8 % (ref 36.0–46.0)
Hemoglobin: 11.9 g/dL — ABNORMAL LOW (ref 12.0–15.0)
MCH: 25.3 pg — ABNORMAL LOW (ref 26.0–34.0)
MCHC: 31.5 g/dL (ref 30.0–36.0)
MCV: 80.4 fL (ref 80.0–100.0)
Platelets: 352 10*3/uL (ref 150–400)
RBC: 4.7 MIL/uL (ref 3.87–5.11)
RDW: 16.5 % — ABNORMAL HIGH (ref 11.5–15.5)
WBC: 19 10*3/uL — ABNORMAL HIGH (ref 4.0–10.5)
nRBC: 0 % (ref 0.0–0.2)

## 2022-11-12 LAB — COMPREHENSIVE METABOLIC PANEL
ALT: 56 U/L — ABNORMAL HIGH (ref 0–44)
AST: 53 U/L — ABNORMAL HIGH (ref 15–41)
Albumin: 3.8 g/dL (ref 3.5–5.0)
Alkaline Phosphatase: 67 U/L (ref 38–126)
Anion gap: 9 (ref 5–15)
BUN: 17 mg/dL (ref 6–20)
CO2: 22 mmol/L (ref 22–32)
Calcium: 8.5 mg/dL — ABNORMAL LOW (ref 8.9–10.3)
Chloride: 107 mmol/L (ref 98–111)
Creatinine, Ser: 0.61 mg/dL (ref 0.44–1.00)
GFR, Estimated: >60 mL/min (ref >60)
Glucose, Bld: 134 mg/dL — ABNORMAL HIGH (ref 70–99)
Potassium: 3.5 mmol/L (ref 3.5–5.1)
Sodium: 138 mmol/L (ref 135–145)
Total Bilirubin: 0.5 mg/dL (ref 0.3–1.2)
Total Protein: 8 g/dL (ref 6.5–8.1)

## 2022-11-12 LAB — URINALYSIS, W/ REFLEX TO CULTURE (INFECTION SUSPECTED)
Bacteria, UA: NONE SEEN
Bilirubin Urine: NEGATIVE
Glucose, UA: NEGATIVE mg/dL
Ketones, ur: NEGATIVE mg/dL
Leukocytes,Ua: NEGATIVE
Nitrite: NEGATIVE
Protein, ur: NEGATIVE mg/dL
Specific Gravity, Urine: 1.039 — ABNORMAL HIGH (ref 1.005–1.030)
pH: 5 (ref 5.0–8.0)

## 2022-11-12 LAB — LIPASE, BLOOD: Lipase: 30 U/L (ref 11–51)

## 2022-11-12 LAB — TROPONIN I (HIGH SENSITIVITY): Troponin I (High Sensitivity): 4 ng/L (ref <18)

## 2022-11-12 MED ORDER — ONDANSETRON HCL 4 MG/2ML IJ SOLN
4.0000 mg | Freq: Once | INTRAMUSCULAR | Status: AC
  Administered 2022-11-12: 4 mg via INTRAVENOUS
  Filled 2022-11-12: qty 2

## 2022-11-12 MED ORDER — KETOROLAC TROMETHAMINE 15 MG/ML IJ SOLN
15.0000 mg | Freq: Once | INTRAMUSCULAR | Status: AC
  Administered 2022-11-12: 15 mg via INTRAVENOUS
  Filled 2022-11-12: qty 1

## 2022-11-12 MED ORDER — AMOXICILLIN-POT CLAVULANATE 875-125 MG PO TABS: 1.0000 | ORAL_TABLET | Freq: Two times a day (BID) | ORAL | 0 refills | Status: AC

## 2022-11-12 MED ORDER — SODIUM CHLORIDE 0.9 % IV BOLUS
1000.0000 mL | Freq: Once | INTRAVENOUS | Status: AC
  Administered 2022-11-12: 1000 mL via INTRAVENOUS

## 2022-11-12 MED ORDER — MORPHINE SULFATE (PF) 4 MG/ML IV SOLN
6.0000 mg | Freq: Once | INTRAVENOUS | Status: AC
  Administered 2022-11-12: 6 mg via INTRAVENOUS
  Filled 2022-11-12: qty 2

## 2022-11-12 MED ORDER — AMOXICILLIN-POT CLAVULANATE 875-125 MG PO TABS
1.0000 | ORAL_TABLET | Freq: Once | ORAL | Status: AC
  Administered 2022-11-12: 1 via ORAL
  Filled 2022-11-12: qty 1

## 2022-11-12 MED ORDER — FENTANYL CITRATE PF 50 MCG/ML IJ SOSY
50.0000 ug | PREFILLED_SYRINGE | Freq: Once | INTRAMUSCULAR | Status: AC
  Administered 2022-11-12: 50 ug via INTRAVENOUS
  Filled 2022-11-12: qty 1

## 2022-11-12 MED ORDER — HYDROMORPHONE HCL 1 MG/ML IJ SOLN
0.5000 mg | Freq: Once | INTRAMUSCULAR | Status: AC
  Administered 2022-11-12: 0.5 mg via INTRAVENOUS
  Filled 2022-11-12: qty 0.5

## 2022-11-12 MED ORDER — ONDANSETRON 4 MG PO TBDP: 4.0000 mg | ORAL_TABLET | Freq: Three times a day (TID) | ORAL | 0 refills | Status: DC | PRN

## 2022-11-12 MED ORDER — OXYCODONE HCL 5 MG PO TABS
5.0000 mg | ORAL_TABLET | Freq: Three times a day (TID) | ORAL | 0 refills | Status: AC | PRN
Start: 2022-11-12 — End: 2022-11-15

## 2022-11-12 MED ORDER — IOHEXOL 300 MG/ML  SOLN
100.0000 mL | Freq: Once | INTRAMUSCULAR | Status: AC | PRN
  Administered 2022-11-12: 80 mL via INTRAVENOUS

## 2022-11-12 NOTE — ED Triage Notes (Signed)
Pt. To ED via EMS for CP and Abdominal pain  since yesterday post laparoscopic cholecystomy on Thursday. Pt. State nausea, vomiting and dizziness this AM.

## 2022-11-12 NOTE — ED Provider Notes (Signed)
[facility] Provider Note    Event Date/Time   First MD Initiated Contact with Patient 11/12/22 [phone]     (approximate)   History   Chest Pain (Pt. To ED via EMS for CP and Abdominal pain  since yesterday post laparoscopic cholecystomy on Thursday. Pt. State nausea, vomiting and dizziness this AM. )   HPI  Daisy Ross is a 47 y.o. female presents to the urgency department with abdominal and chest pain.  Patient had a laparoscopic cholecystectomy on Thursday with Dr. Everlene Farrier, for symptomatic cholelithiasis, who presents to the emergency department with abdominal pain and vomiting.  States that she started having substernal/epigastric abdominal pain and chest pain that started yesterday.  Stated that this started after taking a pain medication so she thought this might be from the pain medication.  Today had worsening episode of nausea and vomiting.  Worsening pain to her chest and her abdomen.  Did have a small bowel movement earlier today.  No blood in her stool or diarrhea.  Denies any fever or chills.  No dysuria, urinary urgency or frequency.  Denies any shortness of breath.  No history DVT or PE.  Not on anticoagulation.  On chart review MRCP that had sludge but no identifiable gallstones.  After shared decision making with Dr. Everlene Farrier, proceed with elective cholecystectomy     Physical Exam   Triage Vital Signs: ED Triage Vitals  Encounter Vitals Group     BP      Systolic BP Percentile      Diastolic BP Percentile      Pulse      Resp      Temp      Temp src      SpO2      Weight      Height      Head Circumference      Peak Flow      Pain Score      Pain Loc      Pain Education      Exclude from Growth Chart     Most recent vital signs: Vitals:   11/12/22 0817 11/12/22 0900  BP: 123/72 111/65  Pulse: 62 61  Resp: 18 17  Temp: 98.1 F (36.7 C)   SpO2: 99% 97%    Physical Exam Constitutional:      General: She is not in acute  distress.    Appearance: She is well-developed.  HENT:     Head: Atraumatic.  Eyes:     Conjunctiva/sclera: Conjunctivae normal.  Cardiovascular:     Rate and Rhythm: Regular rhythm.     Heart sounds: Normal heart sounds.  Pulmonary:     Effort: No respiratory distress.  Abdominal:     General: There is no distension.     Tenderness: There is abdominal tenderness.     Comments: Surgical scars that are well-healing with Dermabond in place.  Diffuse abdominal tenderness to palpation that is worse to the right upper abdomen.  No rebound or guarding.  Musculoskeletal:        General: Normal range of motion.     Cervical back: Normal range of motion.  Skin:    General: Skin is warm.  Neurological:     Mental Status: She is alert. Mental status is at baseline.     IMPRESSION / MDM / ASSESSMENT AND PLAN / ED COURSE  I reviewed the triage vital signs and the nursing notes.  Differential diagnosis including perforation,  bowel abscess, electrolyte abnormality, dehydration, ACS, retained stone, pancreatitis  EKG  I, Corena Herter, the attending physician, personally viewed and interpreted this ECG.   Rate: 62  Rhythm: Normal sinus  Axis: Normal  Intervals: Normal  ST&T Change: None  No tachycardic or bradycardic dysrhythmias while on cardiac telemetry.  RADIOLOGY I independently reviewed imaging, my interpretation of imaging: CT abdomen and pelvis -without acute intra-abdominal pathology on my review.  Quartuss questionable findings of diverticulitis and postsurgical changes that were to be expected.  No signs of common bile duct dilation.  LABS (all labs ordered are listed, but only abnormal results are displayed) Labs interpreted as -    Labs Reviewed  CBC - Abnormal; Notable for the following components:      Result Value   WBC 19.0 (*)    Hemoglobin 11.9 (*)    MCH 25.3 (*)    RDW 16.5 (*)    All other components within normal limits  COMPREHENSIVE METABOLIC PANEL -  Abnormal; Notable for the following components:   Glucose, Bld 134 (*)    Calcium 8.5 (*)    AST 53 (*)    ALT 56 (*)    All other components within normal limits  URINALYSIS, W/ REFLEX TO CULTURE (INFECTION SUSPECTED) - Abnormal; Notable for the following components:   Color, Urine STRAW (*)    APPearance CLEAR (*)    Specific Gravity, Urine 1.039 (*)    Hgb urine dipstick SMALL (*)    All other components within normal limits  LIPASE, BLOOD  TROPONIN I (HIGH SENSITIVITY)     MDM  Given IV fluids, IV antiemetics and pain medication  On reevaluation continued to have pain, given IV ketorolac and Dilaudid for pain control.  No findings of urinary tract infection.  Does have leukocytosis of 19, could be postsurgical versus new infection.  Findings of questionable diverticulitis on CT scan, patient does have focal tenderness to the left lower abdomen.  No signs of perforation or abscess.  Given first dose of Augmentin in the emergency department.  Tolerating p.o.  Given return precautions for any worsening symptoms.  Patient states that she has been feeling sick with Vicodin.  Will switch to oxycodone.  Discussed scheduling Motrin and Tylenol.  Discussed symptomatic treatment.  Given return precautions for any worsening symptoms.  No questions at time of discharge.     PROCEDURES:  Critical Care performed: No  Procedures  Patient's presentation is most consistent with acute presentation with potential threat to life or bodily function.   MEDICATIONS ORDERED IN ED: Medications  HYDROmorphone (DILAUDID) injection 0.5 mg (has no administration in time range)  amoxicillin-clavulanate (AUGMENTIN) 875-125 MG per tablet 1 tablet (has no administration in time range)  sodium chloride 0.9 % bolus 1,000 mL (1,000 mLs Intravenous New Bag/Given 11/12/22 0836)  ondansetron (ZOFRAN) injection 4 mg (4 mg Intravenous Given 11/12/22 0836)  fentaNYL (SUBLIMAZE) injection 50 mcg (50 mcg Intravenous  Given 11/12/22 0837)  iohexol (OMNIPAQUE) 300 MG/ML solution 100 mL (80 mLs Intravenous Contrast Given 11/12/22 0919)  morphine (PF) 4 MG/ML injection 6 mg (6 mg Intravenous Given 11/12/22 0950)  ketorolac (TORADOL) 15 MG/ML injection 15 mg (15 mg Intravenous Given 11/12/22 1013)    FINAL CLINICAL IMPRESSION(S) / ED DIAGNOSES   Final diagnoses:  Generalized abdominal pain  Diverticulitis     Rx / DC Orders   ED Discharge Orders          Ordered    amoxicillin-clavulanate (AUGMENTIN) 875-125 MG  tablet  2 times daily        11/12/22 1139    ondansetron (ZOFRAN-ODT) 4 MG disintegrating tablet  Every 8 hours PRN        11/12/22 1139    oxyCODONE (ROXICODONE) 5 MG immediate release tablet  Every 8 hours PRN        11/12/22 1157             Note:  This document was prepared using Dragon voice recognition software and may include unintentional dictation errors.   Corena Herter, MD 11/12/22 1159

## 2022-11-12 NOTE — Discharge Instructions (Signed)
You are seen in the emergency department for abdominal pain.  Your CT scan showed questionable findings of diverticulitis.  Did not show any findings of complications from your gallbladder surgery.  Your urine was not infected.  It is importantly follow-up as scheduled with your general surgery team.  Follow-up closely with your primary care provider.  Given your ongoing abdominal pain despite removing her gallbladder you likely need a referral to a GI doctor.  You had some findings on CT scan concerning for diverticulitis.  You were started on antibiotics.  We switched up your pain medication since your Vicodin was making her sick.  You can take ibuprofen and Tylenol for pain control.  If you are still in severe pain you can take 1 tablet of her oxycodone.  Start a stool softener if you are taking this medication.  You are started on antibiotic, take as prescribed.  Pain control:  Ibuprofen (motrin/aleve/advil) - You can take 3 tablets (600 mg) every 6 hours as needed for pain/fever.  Acetaminophen (tylenol) - You can take 2 extra strength tablets (1000 mg) every 6 hours as needed for pain/fever.  You can alternate these medications or take them together.  Make sure you eat food/drink water when taking these medications.  You were given a prescription for narcotic pain medications.  Take only if in severe pain.  These are very addictive medications.  These medications can make you constipated.  If you need to take more than 1-2 doses, start a stool softner.  If you become constipated, take 1 capfull of MiraLAX, can repeat untill having regular bowel movements.  Keep this medication out of reach of any children.  zofran (ondansetron) - nausea medication, take 1 tablet every 8 hours as needed for nausea/vomiting.  Thank you for choosing [place] for your health care, it was my pleasure to care for you today!  Corena Herter, MD

## 2022-11-12 NOTE — ED Notes (Signed)
Patient assisted to use restroom at this time.

## 2022-11-12 NOTE — ED Notes (Signed)
Patient complaining of burning while urinating at this time. MD Mumma notified.

## 2022-11-23 ENCOUNTER — Encounter: Payer: Commercial Managed Care - PPO | Admitting: Physician Assistant

## 2022-12-10 DIAGNOSIS — Z9049 Acquired absence of other specified parts of digestive tract: Secondary | ICD-10-CM | POA: Insufficient documentation

## 2022-12-10 NOTE — Patient Instructions (Signed)
Be Involved in Caring For Your Health:  Taking Medications When medications are taken as directed, they can greatly improve your health. But if they are not taken as prescribed, they may not work. In some cases, not taking them correctly can be harmful. To help ensure your treatment remains effective and safe, understand your medications and how to take them. Bring your medications to each visit for review by your provider.  Your lab results, notes, and after visit summary will be available on My Chart. We strongly encourage you to use this feature. If lab results are abnormal the clinic will contact you with the appropriate steps. If the clinic does not contact you assume the results are satisfactory. You can always view your results on My Chart. If you have questions regarding your health or results, please contact the clinic during office hours. You can also ask questions on My Chart.  We at [facility] are grateful that you chose [place] to provide your care. We strive to provide evidence-based and compassionate care and are always looking for feedback. If you get a survey from the clinic please complete this so we can hear your opinions.  Diabetes Mellitus and Foot Care Diabetes, also called diabetes mellitus, may cause problems with your feet and legs because of poor blood flow (circulation). Poor circulation may make your skin: Become thinner and drier. Break more easily. Heal more slowly. Peel and crack. You may also have nerve damage (neuropathy). This can cause decreased feeling in your legs and feet. This means that you may not notice minor injuries to your feet that could lead to more serious problems. Finding and treating problems early is the best way to prevent future foot problems. How to care for your feet Foot hygiene  Wash your feet daily with warm water and mild soap. Do not use hot water. Then, pat your feet and the areas between your toes until they are fully dry. Do  not soak your feet. This can dry your skin. Trim your toenails straight across. Do not dig under them or around the cuticle. File the edges of your nails with an emery board or nail file. Apply a moisturizing lotion or petroleum jelly to the skin on your feet and to dry, brittle toenails. Use lotion that does not contain alcohol and is unscented. Do not apply lotion between your toes. Shoes and socks Wear clean socks or stockings every day. Make sure they are not too tight. Do not wear knee-high stockings. These may decrease blood flow to your legs. Wear shoes that fit well and have enough cushioning. Always look in your shoes before you put them on to be sure there are no objects inside. To break in new shoes, wear them for just a few hours a day. This prevents injuries on your feet. Wounds, scrapes, corns, and calluses  Check your feet daily for blisters, cuts, bruises, sores, and redness. If you cannot see the bottom of your feet, use a mirror or ask someone for help. Do not cut off corns or calluses or try to remove them with medicine. If you find a minor scrape, cut, or break in the skin on your feet, keep it and the skin around it clean and dry. You may clean these areas with mild soap and water. Do not clean the area with peroxide, alcohol, or iodine. If you have a wound, scrape, corn, or callus on your foot, look at it several times a day to make sure it  is healing and not infected. Check for: Redness, swelling, or pain. Fluid or blood. Warmth. Pus or a bad smell. General tips Do not cross your legs. This may decrease blood flow to your feet. Do not use heating pads or hot water bottles on your feet. They may burn your skin. If you have lost feeling in your feet or legs, you may not know this is happening until it is too late. Protect your feet from hot and cold by wearing shoes, such as at the beach or on hot pavement. Schedule a complete foot exam at least once a year or more often if  you have foot problems. Report any cuts, sores, or bruises to your health care provider right away. Where to find more information American Diabetes Association: diabetes.org Association of Diabetes Care & Education Specialists: diabeteseducator.org Contact a health care provider if: You have a condition that increases your risk of infection, and you have any cuts, sores, or bruises on your feet. You have an injury that is not healing. You have redness on your legs or feet. You feel burning or tingling in your legs or feet. You have pain or cramps in your legs and feet. Your legs or feet are numb. Your feet always feel cold. You have pain around any toenails. Get help right away if: You have a wound, scrape, corn, or callus on your foot and: You have signs of infection. You have a fever. You have a red line going up your leg. This information is not intended to replace advice given to you by your health care provider. Make sure you discuss any questions you have with your health care provider. Document Revised: 08/04/2021 Document Reviewed: 08/04/2021 Elsevier Patient Education  2024 [employer].

## 2022-12-12 ENCOUNTER — Ambulatory Visit (INDEPENDENT_AMBULATORY_CARE_PROVIDER_SITE_OTHER): Payer: Commercial Managed Care - PPO | Admitting: Nurse Practitioner

## 2022-12-12 ENCOUNTER — Encounter: Payer: Self-pay | Admitting: Nurse Practitioner

## 2022-12-12 VITALS — BP 128/84 | HR 61 | Temp 97.4°F | Ht 66.5 in | Wt 186.2 lb

## 2022-12-12 DIAGNOSIS — Z9049 Acquired absence of other specified parts of digestive tract: Secondary | ICD-10-CM

## 2022-12-12 DIAGNOSIS — R7989 Other specified abnormal findings of blood chemistry: Secondary | ICD-10-CM | POA: Diagnosis not present

## 2022-12-12 DIAGNOSIS — E1165 Type 2 diabetes mellitus with hyperglycemia: Secondary | ICD-10-CM | POA: Diagnosis not present

## 2022-12-12 DIAGNOSIS — E1169 Type 2 diabetes mellitus with other specified complication: Secondary | ICD-10-CM | POA: Diagnosis not present

## 2022-12-12 DIAGNOSIS — D508 Other iron deficiency anemias: Secondary | ICD-10-CM | POA: Diagnosis not present

## 2022-12-12 DIAGNOSIS — Z23 Encounter for immunization: Secondary | ICD-10-CM

## 2022-12-12 DIAGNOSIS — E785 Hyperlipidemia, unspecified: Secondary | ICD-10-CM

## 2022-12-12 LAB — URINALYSIS, ROUTINE W REFLEX MICROSCOPIC
Bilirubin, UA: NEGATIVE
Glucose, UA: NEGATIVE
Ketones, UA: NEGATIVE
Leukocytes,UA: NEGATIVE
Nitrite, UA: NEGATIVE
Protein,UA: NEGATIVE
RBC, UA: NEGATIVE
Specific Gravity, UA: >1.03 — ABNORMAL HIGH (ref 1.005–1.030)
Urobilinogen, Ur: 0.2 mg/dL (ref 0.2–1.0)
pH, UA: 5.5 (ref 5.0–7.5)

## 2022-12-12 LAB — BAYER DCA HB A1C WAIVED: HB A1C (BAYER DCA - WAIVED): 6.8 % — ABNORMAL HIGH (ref 4.8–5.6)

## 2022-12-12 NOTE — Assessment & Plan Note (Signed)
On 11/17/22, will continue collaboration with [facility] surgical team.  Recent notes reviewed. UA today.

## 2022-12-12 NOTE — Assessment & Plan Note (Signed)
Chronic, ongoing. Did not tolerate Ferrous Sulfate, nausea and GI issues.  Discussed at length with patient, suspect anemia related to heavier menstrual cycles.  Slow Fe daily, which may be better tolerated and offer less side effects.  Educated her on this.  Recheck labs.  Discussed with her considering referral to hematology for possible infusions or GYN to assist with heavier cycles in future.

## 2022-12-12 NOTE — Progress Notes (Signed)
BP 128/84 (BP Location: Left Arm, Patient Position: Sitting, Cuff Size: Normal)   Pulse 61   Temp (!) 97.4 F (36.3 C) (Oral)   Ht 5' 6.5" (1.689 m)   Wt 186 lb 3.2 oz (84.5 kg)   LMP 11/02/2022 (Exact Date) Comment: neg tes 2 days ago  SpO2 100%   BMI 29.60 kg/m    Subjective:    Patient ID: Daisy Ross, female    DOB: 12/28/1975, 47 y.o.   MRN: [id number]  HPI: IRINA CASTELLANO is a 47 y.o. female  Chief Complaint  Patient presents with   Diabetes   Hyperlipidemia   Anemia   DIABETES A1c 7.2%  on 09/12/22, started on Metformin at the time.  Not currently taking this since 26th of September due to recent surgeries, they told her to continue to hold. Hypoglycemic episodes:no Polydipsia/polyuria: no Visual disturbance: no Chest pain: no Paresthesias: no Glucose Monitoring: yes  Accucheck frequency: Daily  Fasting glucose: 130 this morning  Post prandial:  Evening:  Before meals: Taking Insulin?: no  Long acting insulin:  Short acting insulin: Blood Pressure Monitoring: not checking Retinal Examination: Not up to Date is scheduled for [facility] 12/28/22 Foot Exam: Up to Date Diabetic Education: Not Completed Pneumovax:  refuses Influenza:  refuses Aspirin: no   HYPERLIPIDEMIA No current statin therapy. Supplements: none Aspirin:  no The 10-year ASCVD risk score (Arnett DK, et al., 2019) is: 2%   Values used to calculate the score:     Age: 44 years     Sex: Female     Is Non-Hispanic African American: No     Diabetic: Yes     Tobacco smoker: No     Systolic Blood Pressure: 128 mmHg     Is BP treated: No     HDL Cholesterol: 50 mg/dL     Total Cholesterol: 172 mg/dL Chest pain:  no Coronary artery disease:  no Family history CAD:  no Family history early CAD:  no   ANEMIA Noted past labs, to be taking supplement. Has undergone multiple surgeries recently.  Cholecystectomy on 11/10/22 and then partial colectomy on 11/17/22 due to abdominal pain and  diverticulitis.  Had follow-up with surgeon on 11/29/22, is having bad spasm attacks in abdomen.  Her surgeon, Dr. Lynder Parents at [facility], stopped Gabapentin this morning and is to start Tizanidine.  Has completed her Ceftin course of therapy.  Was also noted to have hemorrhagic cyst to left side and is scheduled to see GYN at [facility] -- is aware she needs pap.  Continues to have chills, but not taking temperature at home. Anemia status: stable Etiology of anemia: recent surgeries with history of low levels Duration of anemia treatment: unknown Severity of anemia: moderate Fatigue: no Decreased exercise tolerance: no  Dyspnea on exertion: no Palpitations: no Bleeding: yes -- with recent surgeries Pica: no     12/12/2022    9:43 AM 10/24/2022   10:02 AM 09/12/2022    8:40 AM 08/01/2022    1:28 PM  Depression screen PHQ 2/9  Decreased Interest 2 0 0 0  Down, Depressed, Hopeless 2 0 0 0  PHQ - 2 Score 4 0 0 0  Altered sleeping 2 0 0 2  Tired, decreased energy 0 0 0 1  Change in appetite 0 0 0 1  Feeling bad or failure about yourself  2 0 0 0  Trouble concentrating 0 0 0 0  Moving slowly or fidgety/restless 3 0 0 0  Suicidal thoughts 0 0 0 0  PHQ-9 Score 11 0 0 4  Difficult doing work/chores  Not difficult at all Not difficult at all Not difficult at all       12/12/2022    9:42 AM 10/24/2022   10:02 AM 09/12/2022    8:40 AM 08/01/2022    1:28 PM  GAD 7 : Generalized Anxiety Score  Nervous, Anxious, on Edge 0 0 0 0  Control/stop worrying 2 0 1 1  Worry too much - different things 0 0 0 0  Trouble relaxing 2 0 2 1  Restless 2 0 2 1  Easily annoyed or irritable 0 2 2 2   Afraid - awful might happen 0 0 0 0  Total GAD 7 Score 6 2 7 5   Anxiety Difficulty  Not difficult at all Not difficult at all Not difficult at all     Relevant past medical, surgical, family and social history reviewed and updated as indicated. Interim medical history since our last visit reviewed. Allergies and medications  reviewed and updated.  Review of Systems  Constitutional:  Positive for chills. Negative for activity change, appetite change, diaphoresis, fatigue and fever.  Respiratory:  Negative for cough, chest tightness, shortness of breath and wheezing.   Cardiovascular:  Negative for chest pain, palpitations and leg swelling.  Gastrointestinal:  Positive for abdominal pain. Negative for constipation, diarrhea, nausea and vomiting.  Endocrine: Negative for polydipsia, polyphagia and polyuria.  Neurological: Negative.   Psychiatric/Behavioral: Negative.     Per HPI unless specifically indicated above     Objective:    BP 128/84 (BP Location: Left Arm, Patient Position: Sitting, Cuff Size: Normal)   Pulse 61   Temp (!) 97.4 F (36.3 C) (Oral)   Ht 5' 6.5" (1.689 m)   Wt 186 lb 3.2 oz (84.5 kg)   LMP 11/02/2022 (Exact Date) Comment: neg tes 2 days ago  SpO2 100%   BMI 29.60 kg/m   Wt Readings from Last 3 Encounters:  12/12/22 186 lb 3.2 oz (84.5 kg)  11/12/22 195 lb 8 oz (88.7 kg)  11/07/22 194 lb (88 kg)    Physical Exam Vitals and nursing note reviewed.  Constitutional:      General: She is awake. She is not in acute distress.    Appearance: She is well-developed and well-groomed. She is not ill-appearing or toxic-appearing.  HENT:     Head: Normocephalic.     Right Ear: Hearing and external ear normal.     Left Ear: Hearing and external ear normal.  Eyes:     General: Lids are normal.        Right eye: No discharge.        Left eye: No discharge.     Conjunctiva/sclera: Conjunctivae normal.     Pupils: Pupils are equal, round, and reactive to light.  Neck:     Thyroid: No thyromegaly.     Vascular: No carotid bruit.  Cardiovascular:     Rate and Rhythm: Normal rate and regular rhythm.     Heart sounds: Normal heart sounds. No murmur heard.    No gallop.  Pulmonary:     Effort: Pulmonary effort is normal. No accessory muscle usage or respiratory distress.     Breath  sounds: Normal breath sounds.  Abdominal:     General: Bowel sounds are normal. There is no distension.     Palpations: Abdomen is soft.     Tenderness: There is generalized abdominal tenderness. There is  no right CVA tenderness or left CVA tenderness.     Comments: Multiple incision lines to abdomen from recent surgeries, all healing well.  Musculoskeletal:     Cervical back: Normal range of motion and neck supple.     Right lower leg: No edema.     Left lower leg: No edema.  Lymphadenopathy:     Cervical: No cervical adenopathy.  Skin:    General: Skin is warm and dry.  Neurological:     Mental Status: She is alert and oriented to person, place, and time.     Deep Tendon Reflexes: Reflexes are normal and symmetric.     Reflex Scores:      Brachioradialis reflexes are 2+ on the right side and 2+ on the left side.      Patellar reflexes are 2+ on the right side and 2+ on the left side. Psychiatric:        Attention and Perception: Attention normal.        Mood and Affect: Mood normal.        Speech: Speech normal.        Behavior: Behavior normal. Behavior is cooperative.        Thought Content: Thought content normal.     Results for orders placed or performed during the hospital encounter of 11/12/22  CBC  Result Value Ref Range   WBC 19.0 (H) 4.0 - 10.5 K/uL   RBC 4.70 3.87 - 5.11 MIL/uL   Hemoglobin 11.9 (L) 12.0 - 15.0 g/dL   HCT 60.4 54.0 - 98.1 %   MCV 80.4 80.0 - 100.0 fL   MCH 25.3 (L) 26.0 - 34.0 pg   MCHC 31.5 30.0 - 36.0 g/dL   RDW 19.1 (H) 47.8 - 29.5 %   Platelets 352 150 - 400 K/uL   nRBC 0.0 0.0 - 0.2 %  Comprehensive metabolic panel  Result Value Ref Range   Sodium 138 135 - 145 mmol/L   Potassium 3.5 3.5 - 5.1 mmol/L   Chloride 107 98 - 111 mmol/L   CO2 22 22 - 32 mmol/L   Glucose, Bld 134 (H) 70 - 99 mg/dL   BUN 17 6 - 20 mg/dL   Creatinine, Ser 6.21 0.44 - 1.00 mg/dL   Calcium 8.5 (L) 8.9 - 10.3 mg/dL   Total Protein 8.0 6.5 - 8.1 g/dL    Albumin 3.8 3.5 - 5.0 g/dL   AST 53 (H) 15 - 41 U/L   ALT 56 (H) 0 - 44 U/L   Alkaline Phosphatase 67 38 - 126 U/L   Total Bilirubin 0.5 0.3 - 1.2 mg/dL   GFR, Estimated >30 >86 mL/min   Anion gap 9 5 - 15  Lipase, blood  Result Value Ref Range   Lipase 30 11 - 51 U/L  Urinalysis, w/ Reflex to Culture (Infection Suspected) -Urine, Clean Catch  Result Value Ref Range   Specimen Source URINE, CLEAN CATCH    Color, Urine STRAW (A) YELLOW   APPearance CLEAR (A) CLEAR   Specific Gravity, Urine 1.039 (H) 1.005 - 1.030   pH 5.0 5.0 - 8.0   Glucose, UA NEGATIVE NEGATIVE mg/dL   Hgb urine dipstick SMALL (A) NEGATIVE   Bilirubin Urine NEGATIVE NEGATIVE   Ketones, ur NEGATIVE NEGATIVE mg/dL   Protein, ur NEGATIVE NEGATIVE mg/dL   Nitrite NEGATIVE NEGATIVE   Leukocytes,Ua NEGATIVE NEGATIVE   RBC / HPF 0-5 0 - 5 RBC/hpf   WBC, UA 0-5 0 - 5 WBC/hpf  Bacteria, UA NONE SEEN NONE SEEN   Squamous Epithelial / HPF 0-5 0 - 5 /HPF   Budding Yeast PRESENT   Troponin I (High Sensitivity)  Result Value Ref Range   Troponin I (High Sensitivity) 4 <18 ng/L      Assessment & Plan:   Problem List Items Addressed This Visit       Endocrine   Hyperlipidemia associated with type 2 diabetes mellitus (HCC)    Ongoing, will check lipid panel today and discuss statin in future once she is feeling better.      Relevant Orders   Bayer DCA Hb A1c Waived   Comprehensive metabolic panel   Lipid Panel w/o Chol/HDL Ratio   Type 2 diabetes mellitus (HCC) - Primary    Ongoing, initial diagnosis 06/02/22 with A1c 7.3%, today A1c 6.8%, jMetformin on hold for now due to recent surgeries.   Strong family history in females of this.  Educated on this medication use and side effects, to reach out to provider if any ADR.  Recommend heavy focus on diet changes at home.  Check CMP, Lipid. - No current statin or ACE/ARB, will further discuss future visits - Scheduled for eye exam ([facility]) and have them send records  to [place]. - Foot exam up to date - Will discuss PCV20 future visit.      Relevant Orders   Bayer DCA Hb A1c Waived     Other   Elevated LFTs    Noted on past labs, mild elevation in ALT.  Labs today to recheck and consider imaging if ongoing.      History of partial colectomy    On 11/17/22, will continue collaboration with [facility] surgical team.  Recent notes reviewed. UA today.      Relevant Orders   Urinalysis, Routine w reflex microscopic   Iron deficiency anemia    Chronic, ongoing. Did not tolerate Ferrous Sulfate, nausea and GI issues.  Discussed at length with patient, suspect anemia related to heavier menstrual cycles.  Slow Fe daily, which may be better tolerated and offer less side effects.  Educated her on this.  Recheck labs.  Discussed with her considering referral to hematology for possible infusions or GYN to assist with heavier cycles in future.        Relevant Orders   CBC with Differential/Platelet   Iron Binding Cap (TIBC)([facility])   Ferritin     Follow up plan: Return in about 6 weeks (around 01/23/2023) for Abdominal check.

## 2022-12-12 NOTE — Progress Notes (Signed)
Contacted via MyChart   Good news!!  Urine overall is reassuring.  Great news!!

## 2022-12-12 NOTE — Assessment & Plan Note (Signed)
Ongoing, will check lipid panel today and discuss statin in future once she is feeling better.

## 2022-12-12 NOTE — Assessment & Plan Note (Signed)
Ongoing, initial diagnosis 06/02/22 with A1c 7.3%, today A1c 6.8%, jMetformin on hold for now due to recent surgeries.   Strong family history in females of this.  Educated on this medication use and side effects, to reach out to provider if any ADR.  Recommend heavy focus on diet changes at home.  Check CMP, Lipid. - No current statin or ACE/ARB, will further discuss future visits - Scheduled for eye exam ([facility]) and have them send records to [place]. - Foot exam up to date - Will discuss PCV20 future visit.

## 2022-12-12 NOTE — Assessment & Plan Note (Signed)
Noted on past labs, mild elevation in ALT.  Labs today to recheck and consider imaging if ongoing.

## 2022-12-13 NOTE — Progress Notes (Signed)
Contacted via MyChart   So far only CBC back and hemoglobin a little low, try adding iron rich foods to diet.  We will see what remainder of labs show.

## 2022-12-15 LAB — CBC WITH DIFFERENTIAL/PLATELET
Basophils Absolute: 0.1 10*3/uL (ref 0.0–0.2)
Basos: 1 %
EOS (ABSOLUTE): 0.1 10*3/uL (ref 0.0–0.4)
Eos: 2 %
Hematocrit: 35.5 % (ref 34.0–46.6)
Hemoglobin: 11 g/dL — ABNORMAL LOW (ref 11.1–15.9)
Immature Grans (Abs): 0 10*3/uL (ref 0.0–0.1)
Immature Granulocytes: 0 %
Lymphocytes Absolute: 1.6 10*3/uL (ref 0.7–3.1)
Lymphs: 26 %
MCH: 24.9 pg — ABNORMAL LOW (ref 26.6–33.0)
MCHC: 31 g/dL — ABNORMAL LOW (ref 31.5–35.7)
MCV: 80 fL (ref 79–97)
Monocytes Absolute: 0.6 10*3/uL (ref 0.1–0.9)
Monocytes: 10 %
Neutrophils Absolute: 3.7 10*3/uL (ref 1.4–7.0)
Neutrophils: 61 %
Platelets: 367 10*3/uL (ref 150–450)
RBC: 4.42 x10E6/uL (ref 3.77–5.28)
RDW: 14.4 % (ref 11.7–15.4)
WBC: 6.1 10*3/uL (ref 3.4–10.8)

## 2022-12-15 LAB — COMPREHENSIVE METABOLIC PANEL
ALT: 36 IU/L — ABNORMAL HIGH (ref 0–32)
AST: 38 IU/L (ref 0–40)
Albumin: 4.1 g/dL (ref 3.9–4.9)
Alkaline Phosphatase: 92 [IU]/L (ref 44–121)
BUN/Creatinine Ratio: 19 (ref 9–23)
BUN: 10 mg/dL (ref 6–24)
Bilirubin Total: <0.2 mg/dL (ref 0.0–1.2)
CO2: 21 mmol/L (ref 20–29)
Calcium: 9.5 mg/dL (ref 8.7–10.2)
Chloride: 103 mmol/L (ref 96–106)
Creatinine, Ser: 0.54 mg/dL — ABNORMAL LOW (ref 0.57–1.00)
Globulin, Total: 3.6 g/dL (ref 1.5–4.5)
Glucose: 109 mg/dL — ABNORMAL HIGH (ref 70–99)
Potassium: 4.5 mmol/L (ref 3.5–5.2)
Sodium: 138 mmol/L (ref 134–144)
Total Protein: 7.7 g/dL (ref 6.0–8.5)
eGFR: 113 mL/min/{1.73_m2} (ref >59)

## 2022-12-15 LAB — IRON AND TIBC
Iron Saturation: 11 % — ABNORMAL LOW (ref 15–55)
Iron: 44 ug/dL (ref 27–159)
Total Iron Binding Capacity: 393 ug/dL (ref 250–450)
UIBC: 349 ug/dL (ref 131–425)

## 2022-12-15 LAB — LIPID PANEL W/O CHOL/HDL RATIO
Cholesterol, Total: 184 mg/dL (ref 100–199)
HDL: 50 mg/dL (ref >39)
LDL Chol Calc (NIH): 110 mg/dL — ABNORMAL HIGH (ref 0–99)
Triglycerides: 137 mg/dL (ref 0–149)
VLDL Cholesterol Cal: 24 mg/dL (ref 5–40)

## 2022-12-15 LAB — FERRITIN: Ferritin: 28 ng/mL (ref 15–150)

## 2022-12-15 NOTE — Progress Notes (Signed)
Contacted via MyChart   Good evening Daisy Ross, your labs have returned.  Iron is a little low, add some iron rich foods to diet or an iron supplement.  Remainder of labs stable.  LDL remains a little high,we will discuss this more next visit.  Any questions? Keep being amazing!!  Thank you for allowing me to participate in your care.  I appreciate you. Kindest regards, Byran Bilotti

## 2022-12-28 LAB — HM DIABETES EYE EXAM

## 2023-01-06 ENCOUNTER — Encounter: Payer: Self-pay | Admitting: Nurse Practitioner

## 2023-01-06 NOTE — Telephone Encounter (Signed)
Care team updated and letter sent for eye exam notes.

## 2023-01-21 NOTE — Patient Instructions (Signed)

## 2023-01-23 ENCOUNTER — Ambulatory Visit (INDEPENDENT_AMBULATORY_CARE_PROVIDER_SITE_OTHER): Payer: Commercial Managed Care - PPO | Admitting: Nurse Practitioner

## 2023-01-23 ENCOUNTER — Encounter: Payer: Self-pay | Admitting: Nurse Practitioner

## 2023-01-23 VITALS — BP 132/70 | HR 63 | Temp 98.0°F | Ht 66.5 in | Wt 191.6 lb

## 2023-01-23 DIAGNOSIS — R1032 Left lower quadrant pain: Secondary | ICD-10-CM

## 2023-01-23 DIAGNOSIS — R109 Unspecified abdominal pain: Secondary | ICD-10-CM | POA: Insufficient documentation

## 2023-01-23 NOTE — Assessment & Plan Note (Signed)
Ongoing post surgery, right side is improving.  Recommend she continue PT which is offering benefit and may take Tylenol or Ibuprofen as needed.  She can wear her abdominal compression as needed + heating pad may offer benefit.  Continue to collaborate with surgery team at [facility].

## 2023-01-23 NOTE — Progress Notes (Signed)
BP 132/70   Pulse 63   Temp 98 F (36.7 C) (Oral)   Ht 5' 6.5" (1.689 m)   Wt 191 lb 9.6 oz (86.9 kg)   LMP 01/23/2023 (Exact Date)   SpO2 98%   BMI 30.46 kg/m    Subjective:    Patient ID: Daisy Ross, female    DOB: January 22, 1976, 47 y.o.   MRN: [id number]  HPI: Daisy Ross is a 47 y.o. female  Chief Complaint  Patient presents with   Abdominal Pain    6 week f/up- patient states she is still very tender in the lower part of her abdomen   ABDOMINAL PAIN  Follow-up today.  Cholecystectomy on 11/10/22 and then partial colectomy on 11/17/22 due to abdominal pain and diverticulitis.  Had follow-up with surgeon on 12/27/22, returns today for PT -- is doing PT to help with discomfort to left lower side.  Her surgeon, Dr. Lynder Parents at [facility].  Was also noted to have hemorrhagic cyst to left side and saw GYN for this on 01/11/23, they prescribed Lysteda for during menstrual cycles for fibroids and heavier bleeding.   Continues to have tenderness to lower abdomen left side -- right side has improved.  No pain medications at home.  Is feeling constipated lately, last BM had 3 bowel movements with out straining.  Taking Miralax daily.  Has not been taking Metformin due to bowel movements -- last A1c 6.8%. Severity: 3/10 Quality: sharp, dull, aching, stabbing, and throbbing Location:  LLQ  Episode duration:  Radiation: no Frequency: intermittent Alleviating factors: rest and no lifting Aggravating factors: lifting Status: fluctuating Treatments attempted: physical therapy Fever: no Nausea: yes  Vomiting: no Weight loss: no Decreased appetite:  slightly Diarrhea: no Constipation: yes at times Blood in stool: no Heartburn: no Jaundice: no Rash: no Dysuria/urinary frequency: no Hematuria: no History of sexually transmitted disease: no Recurrent NSAID use: no   Relevant past medical, surgical, family and social history reviewed and updated as indicated. Interim medical history  since our last visit reviewed. Allergies and medications reviewed and updated.  Review of Systems  Constitutional:  Negative for activity change, appetite change, chills, diaphoresis, fatigue and fever.  Respiratory:  Negative for cough, chest tightness, shortness of breath and wheezing.   Cardiovascular:  Negative for chest pain, palpitations and leg swelling.  Gastrointestinal:  Positive for abdominal pain (LLQ), constipation and nausea. Negative for abdominal distention, diarrhea and vomiting.  Neurological: Negative.   Psychiatric/Behavioral: Negative.     Per HPI unless specifically indicated above     Objective:    BP 132/70   Pulse 63   Temp 98 F (36.7 C) (Oral)   Ht 5' 6.5" (1.689 m)   Wt 191 lb 9.6 oz (86.9 kg)   LMP 01/23/2023 (Exact Date)   SpO2 98%   BMI 30.46 kg/m   Wt Readings from Last 3 Encounters:  01/23/23 191 lb 9.6 oz (86.9 kg)  12/12/22 186 lb 3.2 oz (84.5 kg)  11/12/22 195 lb 8 oz (88.7 kg)    Physical Exam Vitals and nursing note reviewed.  Constitutional:      General: She is awake. She is not in acute distress.    Appearance: She is well-developed and well-groomed. She is not ill-appearing or toxic-appearing.  HENT:     Head: Normocephalic.     Right Ear: Hearing and external ear normal.     Left Ear: Hearing and external ear normal.  Eyes:     General:  Lids are normal.        Right eye: No discharge.        Left eye: No discharge.     Conjunctiva/sclera: Conjunctivae normal.     Pupils: Pupils are equal, round, and reactive to light.  Neck:     Thyroid: No thyromegaly.     Vascular: No carotid bruit.  Cardiovascular:     Rate and Rhythm: Normal rate and regular rhythm.     Heart sounds: Normal heart sounds. No murmur heard.    No gallop.  Pulmonary:     Effort: Pulmonary effort is normal. No accessory muscle usage or respiratory distress.     Breath sounds: Normal breath sounds.  Abdominal:     General: Bowel sounds are normal. There  is no distension.     Palpations: Abdomen is soft.     Tenderness: There is abdominal tenderness in the right lower quadrant and left lower quadrant. There is no right CVA tenderness or left CVA tenderness.     Comments: Multiple incision lines to abdomen from recent surgeries, all healed.  LLQ more tender than RLQ.  Musculoskeletal:     Cervical back: Normal range of motion and neck supple.     Right lower leg: No edema.     Left lower leg: No edema.  Lymphadenopathy:     Cervical: No cervical adenopathy.  Skin:    General: Skin is warm and dry.  Neurological:     Mental Status: She is alert and oriented to person, place, and time.     Deep Tendon Reflexes: Reflexes are normal and symmetric.     Reflex Scores:      Brachioradialis reflexes are 2+ on the right side and 2+ on the left side.      Patellar reflexes are 2+ on the right side and 2+ on the left side. Psychiatric:        Attention and Perception: Attention normal.        Mood and Affect: Mood normal.        Speech: Speech normal.        Behavior: Behavior normal. Behavior is cooperative.        Thought Content: Thought content normal.     Results for orders placed or performed in visit on 01/10/23  HM DIABETES EYE EXAM  Result Value Ref Range   HM Diabetic Eye Exam No Retinopathy No Retinopathy      Assessment & Plan:   Problem List Items Addressed This Visit       Other   Abdominal pain - Primary    Ongoing post surgery, right side is improving.  Recommend she continue PT which is offering benefit and may take Tylenol or Ibuprofen as needed.  She can wear her abdominal compression as needed + heating pad may offer benefit.  Continue to collaborate with surgery team at [facility].        Follow up plan: Return in about 7 weeks (around 03/13/2023) for T2DM, HTN/HLD.

## 2023-01-24 ENCOUNTER — Other Ambulatory Visit: Payer: Self-pay | Admitting: Nurse Practitioner

## 2023-01-25 NOTE — Telephone Encounter (Signed)
Requested Prescriptions  Pending Prescriptions Disp Refills   amitriptyline (ELAVIL) 10 MG tablet [Pharmacy Med Name: AMITRIPTYLINE HCL 10 MG TAB] 90 tablet 0    Sig: TAKE 1 TABLET BY MOUTH EVERYDAY AT BEDTIME     Psychiatry:  Antidepressants - Heterocyclics (TCAs) Passed - 01/24/2023  2:31 AM      Passed - Valid encounter within last 6 months    Recent Outpatient Visits           2 days ago Left lower quadrant abdominal pain   [place] [facility] [place], [place] T, NP   1 month ago Type 2 diabetes mellitus with hyperglycemia, without long-term current use of insulin (HCC)   [place] [facility] [place], [place] T, NP   3 months ago Post-concussion headache   [place] [facility] [place], [place] T, NP   4 months ago Type 2 diabetes mellitus with hyperglycemia, without long-term current use of insulin (HCC)   [place] [facility] [place], [place] T, NP   5 months ago Type 2 diabetes mellitus with hyperglycemia, without long-term current use of insulin (HCC)   [place] [facility] [place], Dorie Rank, NP       Future Appointments             In 1 month [place], Dorie Rank, NP [place] [facility]

## 2023-01-29 ENCOUNTER — Other Ambulatory Visit: Payer: Self-pay | Admitting: Nurse Practitioner

## 2023-01-30 ENCOUNTER — Other Ambulatory Visit: Payer: Self-pay

## 2023-01-30 MED ORDER — FERROUS SULFATE 220 (44 FE) MG/5ML PO SOLN
220.0000 mg | Freq: Every day | ORAL | 6 refills | Status: DC
  Filled 2023-01-30: qty 150, 30d supply, fill #0

## 2023-01-31 ENCOUNTER — Ambulatory Visit: Payer: Self-pay | Admitting: Nurse Practitioner

## 2023-02-14 ENCOUNTER — Other Ambulatory Visit: Payer: Self-pay

## 2023-03-11 NOTE — Patient Instructions (Incomplete)
Be Involved in Caring For Your Health:  Taking Medications When medications are taken as directed, they can greatly improve your health. But if they are not taken as prescribed, they may not work. In some cases, not taking them correctly can be harmful. To help ensure your treatment remains effective and safe, understand your medications and how to take them. Bring your medications to each visit for review by your provider.  Your lab results, notes, and after visit summary will be available on My Chart. We strongly encourage you to use this feature. If lab results are abnormal the clinic will contact you with the appropriate steps. If the clinic does not contact you assume the results are satisfactory. You can always view your results on My Chart. If you have questions regarding your health or results, please contact the clinic during office hours. You can also ask questions on My Chart.  We at [facility] are grateful that you chose [place] to provide your care. We strive to provide evidence-based and compassionate care and are always looking for feedback. If you get a survey from the clinic please complete this so we can hear your opinions.  Diabetes Mellitus and Foot Care Diabetes, also called diabetes mellitus, may cause problems with your feet and legs because of poor blood flow (circulation). Poor circulation may make your skin: Become thinner and drier. Break more easily. Heal more slowly. Peel and crack. You may also have nerve damage (neuropathy). This can cause decreased feeling in your legs and feet. This means that you may not notice minor injuries to your feet that could lead to more serious problems. Finding and treating problems early is the best way to prevent future foot problems. How to care for your feet Foot hygiene  Wash your feet daily with warm water and mild soap. Do not use hot water. Then, pat your feet and the areas between your toes until they are fully dry. Do  not soak your feet. This can dry your skin. Trim your toenails straight across. Do not dig under them or around the cuticle. File the edges of your nails with an emery board or nail file. Apply a moisturizing lotion or petroleum jelly to the skin on your feet and to dry, brittle toenails. Use lotion that does not contain alcohol and is unscented. Do not apply lotion between your toes. Shoes and socks Wear clean socks or stockings every day. Make sure they are not too tight. Do not wear knee-high stockings. These may decrease blood flow to your legs. Wear shoes that fit well and have enough cushioning. Always look in your shoes before you put them on to be sure there are no objects inside. To break in new shoes, wear them for just a few hours a day. This prevents injuries on your feet. Wounds, scrapes, corns, and calluses  Check your feet daily for blisters, cuts, bruises, sores, and redness. If you cannot see the bottom of your feet, use a mirror or ask someone for help. Do not cut off corns or calluses or try to remove them with medicine. If you find a minor scrape, cut, or break in the skin on your feet, keep it and the skin around it clean and dry. You may clean these areas with mild soap and water. Do not clean the area with peroxide, alcohol, or iodine. If you have a wound, scrape, corn, or callus on your foot, look at it several times a day to make sure it  is healing and not infected. Check for: Redness, swelling, or pain. Fluid or blood. Warmth. Pus or a bad smell. General tips Do not cross your legs. This may decrease blood flow to your feet. Do not use heating pads or hot water bottles on your feet. They may burn your skin. If you have lost feeling in your feet or legs, you may not know this is happening until it is too late. Protect your feet from hot and cold by wearing shoes, such as at the beach or on hot pavement. Schedule a complete foot exam at least once a year or more often if  you have foot problems. Report any cuts, sores, or bruises to your health care provider right away. Where to find more information American Diabetes Association: diabetes.org Association of Diabetes Care & Education Specialists: diabeteseducator.org Contact a health care provider if: You have a condition that increases your risk of infection, and you have any cuts, sores, or bruises on your feet. You have an injury that is not healing. You have redness on your legs or feet. You feel burning or tingling in your legs or feet. You have pain or cramps in your legs and feet. Your legs or feet are numb. Your feet always feel cold. You have pain around any toenails. Get help right away if: You have a wound, scrape, corn, or callus on your foot and: You have signs of infection. You have a fever. You have a red line going up your leg. This information is not intended to replace advice given to you by your health care provider. Make sure you discuss any questions you have with your health care provider. Document Revised: 08/04/2021 Document Reviewed: 08/04/2021 Elsevier Patient Education  2024 [employer].

## 2023-03-13 ENCOUNTER — Ambulatory Visit: Payer: Commercial Managed Care - PPO | Admitting: Nurse Practitioner

## 2023-03-13 ENCOUNTER — Encounter: Payer: Self-pay | Admitting: Nurse Practitioner

## 2023-03-13 VITALS — BP 107/68 | HR 61 | Temp 98.6°F | Ht 66.5 in | Wt 187.2 lb

## 2023-03-13 DIAGNOSIS — E785 Hyperlipidemia, unspecified: Secondary | ICD-10-CM

## 2023-03-13 DIAGNOSIS — D508 Other iron deficiency anemias: Secondary | ICD-10-CM | POA: Diagnosis not present

## 2023-03-13 DIAGNOSIS — E1169 Type 2 diabetes mellitus with other specified complication: Secondary | ICD-10-CM | POA: Diagnosis not present

## 2023-03-13 DIAGNOSIS — E1165 Type 2 diabetes mellitus with hyperglycemia: Secondary | ICD-10-CM

## 2023-03-13 DIAGNOSIS — R7989 Other specified abnormal findings of blood chemistry: Secondary | ICD-10-CM

## 2023-03-13 LAB — MICROALBUMIN, URINE WAIVED
Creatinine, Urine Waived: 300 mg/dL (ref 10–300)
Microalb, Ur Waived: 80 mg/L — ABNORMAL HIGH (ref 0–19)

## 2023-03-13 LAB — BAYER DCA HB A1C WAIVED: HB A1C (BAYER DCA - WAIVED): 7.1 % — ABNORMAL HIGH (ref 4.8–5.6)

## 2023-03-13 NOTE — Assessment & Plan Note (Signed)
Ongoing and improving.  Noted on past labs, mild elevation in ALT.  Labs today to recheck and consider imaging if ongoing.

## 2023-03-13 NOTE — Assessment & Plan Note (Addendum)
Ongoing, will check lipid panel today and discuss statin in future once fully healed from surgery.

## 2023-03-13 NOTE — Assessment & Plan Note (Signed)
Ongoing, initial diagnosis 06/02/22 with A1c 7.3%, today A1c 7.1%, slight trend up. She wishes to continue off Metformin and work on diet and exercise.  Strong family history in females of this.  Educated on this medication use and side effects, may need to restart Metformin in future.  Recommend heavy focus on diet changes at home.  Urine ALB 80 February 2025 -- would benefit a very low dose ARB/ACE in future.  Check CMP, Lipid. - No current statin or ACE/ARB, will further discuss future visits - Eye and foot exam up to date - Will discuss PCV20 future visit.

## 2023-03-13 NOTE — Assessment & Plan Note (Signed)
Chronic, ongoing. Did not tolerate Ferrous Sulfate, nausea and GI issues.  Recheck labs.  Discussed with her considering referral to hematology for possible infusions or GYN to assist with heavier cycles in future if needed.

## 2023-03-13 NOTE — Progress Notes (Signed)
BP 107/68   Pulse 61   Temp 98.6 F (37 C) (Oral)   Ht 5' 6.5" (1.689 m)   Wt 187 lb 3.2 oz (84.9 kg)   LMP 02/17/2023 (Approximate)   SpO2 98%   BMI 29.76 kg/m    Subjective:    Patient ID: Daisy Ross, female    DOB: [birth year], 48 y.o.   MRN: [id number]  HPI: Daisy Ross is a 48 y.o. female  Chief Complaint  Patient presents with   Diabetes   Hyperlipidemia   Hypertension   DIABETES A1c 7.2%  on 09/12/22, started on Metformin at the time.  Stopped taking in September 2024 due to surgery.  Her A1c on 12/12/22 was 6.8%.  Monitoring diet. Hypoglycemic episodes:no Polydipsia/polyuria: no Visual disturbance: no Chest pain: no Paresthesias: no Glucose Monitoring: yes  Accucheck frequency: a few times a week  Fasting glucose: none above 125  Post prandial:  Evening:  Before meals: Taking Insulin?: no  Long acting insulin:  Short acting insulin: Blood Pressure Monitoring: not checking Retinal Examination: Up To Date 12/28/2022 [facility] Foot Exam: Up to Date Diabetic Education: Not Completed Pneumovax:  refuses Influenza:  refuses Aspirin: no   HYPERLIPIDEMIA No current statin therapy. Supplements: none Aspirin:  no The 10-year ASCVD risk score (Arnett DK, et al., 2019) is: 1.6%   Values used to calculate the score:     Age: 87 years     Sex: Female     Is Non-Hispanic African American: No     Diabetic: Yes     Tobacco smoker: No     Systolic Blood Pressure: 107 mmHg     Is BP treated: No     HDL Cholesterol: 50 mg/dL     Total Cholesterol: 184 mg/dL Chest pain:  no Coronary artery disease:  no Family history CAD:  no Family history early CAD:  no   ANEMIA Due to all recent surgeries and heavier menstrual cycles.  Sleeping still not good due to RLS and pain left side, she is going to get shot for this upcoming.  Did PT for period. Anemia status: stable Etiology of anemia: recent surgeries with history of low levels Duration of anemia  treatment: unknown Severity of anemia: moderate Fatigue: no Decreased exercise tolerance: no  Dyspnea on exertion: no Palpitations: no Bleeding: only with recent surgeries Pica: no     03/13/2023    9:53 AM 01/23/2023   10:14 AM 12/12/2022    9:43 AM 10/24/2022   10:02 AM 09/12/2022    8:40 AM  Depression screen PHQ 2/9  Decreased Interest 2 2 2  0 0  Down, Depressed, Hopeless 0 0 2 0 0  PHQ - 2 Score 2 2 4  0 0  Altered sleeping 0 2 2 0 0  Tired, decreased energy 2 0 0 0 0  Change in appetite 0 1 0 0 0  Feeling bad or failure about yourself  0 0 2 0 0  Trouble concentrating 0 0 0 0 0  Moving slowly or fidgety/restless 1 2 3  0 0  Suicidal thoughts 0 0 0 0 0  PHQ-9 Score 5 7 11  0 0  Difficult doing work/chores Not difficult at all Somewhat difficult  Not difficult at all Not difficult at all       03/13/2023    9:54 AM 01/23/2023   10:14 AM 12/12/2022    9:42 AM 10/24/2022   10:02 AM  GAD 7 : Generalized Anxiety Score  Nervous,  Anxious, on Edge 0 0 0 0  Control/stop worrying 0 2 2 0  Worry too much - different things 2 2 0 0  Trouble relaxing 2 2 2  0  Restless 2 2 2  0  Easily annoyed or irritable 0 0 0 2  Afraid - awful might happen 0 0 0 0  Total GAD 7 Score 6 8 6 2   Anxiety Difficulty Not difficult at all Not difficult at all  Not difficult at all   Relevant past medical, surgical, family and social history reviewed and updated as indicated. Interim medical history since our last visit reviewed. Allergies and medications reviewed and updated.  Review of Systems  Constitutional:  Negative for activity change, appetite change, fatigue and fever.  Respiratory:  Negative for cough, chest tightness, shortness of breath and wheezing.   Cardiovascular:  Negative for chest pain, palpitations and leg swelling.  Gastrointestinal: Negative.   Endocrine: Negative for polydipsia, polyphagia and polyuria.  Neurological: Negative.   Psychiatric/Behavioral: Negative.     Per HPI  unless specifically indicated above     Objective:    BP 107/68   Pulse 61   Temp 98.6 F (37 C) (Oral)   Ht 5' 6.5" (1.689 m)   Wt 187 lb 3.2 oz (84.9 kg)   LMP 02/17/2023 (Approximate)   SpO2 98%   BMI 29.76 kg/m   Wt Readings from Last 3 Encounters:  03/13/23 187 lb 3.2 oz (84.9 kg)  01/23/23 191 lb 9.6 oz (86.9 kg)  12/12/22 186 lb 3.2 oz (84.5 kg)    Physical Exam Vitals and nursing note reviewed.  Constitutional:      General: She is awake. She is not in acute distress.    Appearance: She is well-developed and well-groomed. She is not ill-appearing or toxic-appearing.  HENT:     Head: Normocephalic.     Right Ear: Hearing and external ear normal.     Left Ear: Hearing and external ear normal.  Eyes:     General: Lids are normal.        Right eye: No discharge.        Left eye: No discharge.     Conjunctiva/sclera: Conjunctivae normal.     Pupils: Pupils are equal, round, and reactive to light.  Neck:     Thyroid: No thyromegaly.     Vascular: No carotid bruit.  Cardiovascular:     Rate and Rhythm: Normal rate and regular rhythm.     Heart sounds: Normal heart sounds. No murmur heard.    No gallop.  Pulmonary:     Effort: Pulmonary effort is normal. No accessory muscle usage or respiratory distress.     Breath sounds: Normal breath sounds.  Abdominal:     General: Bowel sounds are normal. There is no distension.     Palpations: Abdomen is soft.     Tenderness: There is no abdominal tenderness.  Musculoskeletal:     Cervical back: Normal range of motion and neck supple.     Right lower leg: No edema.     Left lower leg: No edema.  Lymphadenopathy:     Cervical: No cervical adenopathy.  Skin:    General: Skin is warm and dry.  Neurological:     Mental Status: She is alert and oriented to person, place, and time.     Deep Tendon Reflexes: Reflexes are normal and symmetric.     Reflex Scores:      Brachioradialis reflexes are 2+ on the right  side and 2+  on the left side.      Patellar reflexes are 2+ on the right side and 2+ on the left side. Psychiatric:        Attention and Perception: Attention normal.        Mood and Affect: Mood normal.        Speech: Speech normal.        Behavior: Behavior normal. Behavior is cooperative.        Thought Content: Thought content normal.    Results for orders placed or performed in visit on 01/10/23  HM DIABETES EYE EXAM   Collection Time: 12/28/22  8:08 AM  Result Value Ref Range   HM Diabetic Eye Exam No Retinopathy No Retinopathy      Assessment & Plan:   Problem List Items Addressed This Visit       Endocrine   Hyperlipidemia associated with type 2 diabetes mellitus (HCC)   Ongoing, will check lipid panel today and discuss statin in future once fully healed from surgery.      Relevant Orders   Bayer DCA Hb A1c Waived   Lipid Panel w/o Chol/HDL Ratio   Comprehensive metabolic panel   Type 2 diabetes mellitus (HCC) - Primary   Ongoing, initial diagnosis 06/02/22 with A1c 7.3%, today A1c 7.1%, slight trend up. She wishes to continue off Metformin and work on diet and exercise.  Strong family history in females of this.  Educated on this medication use and side effects, may need to restart Metformin in future.  Recommend heavy focus on diet changes at home.  Urine ALB 80 February 2025 -- would benefit a very low dose ARB/ACE in future.  Check CMP, Lipid. - No current statin or ACE/ARB, will further discuss future visits - Eye and foot exam up to date - Will discuss PCV20 future visit.      Relevant Orders   Bayer DCA Hb A1c Waived   Microalbumin, Urine Waived     Other   Elevated LFTs   Ongoing and improving.  Noted on past labs, mild elevation in ALT.  Labs today to recheck and consider imaging if ongoing.      Iron deficiency anemia   Chronic, ongoing. Did not tolerate Ferrous Sulfate, nausea and GI issues.  Recheck labs.  Discussed with her considering referral to hematology  for possible infusions or GYN to assist with heavier cycles in future if needed.      Relevant Orders   Ferritin   Iron   CBC with Differential/Platelet     Follow up plan: Return in about 3 months (around 06/11/2023) for T2DM and HLD + ANEMIA.

## 2023-03-14 ENCOUNTER — Encounter: Payer: Self-pay | Admitting: Nurse Practitioner

## 2023-03-14 LAB — CBC WITH DIFFERENTIAL/PLATELET
Basophils Absolute: 0 10*3/uL (ref 0.0–0.2)
Basos: 1 %
EOS (ABSOLUTE): 0.1 10*3/uL (ref 0.0–0.4)
Eos: 1 %
Hematocrit: 38.4 % (ref 34.0–46.6)
Hemoglobin: 12.3 g/dL (ref 11.1–15.9)
Immature Grans (Abs): 0 10*3/uL (ref 0.0–0.1)
Immature Granulocytes: 0 %
Lymphocytes Absolute: 1.7 10*3/uL (ref 0.7–3.1)
Lymphs: 30 %
MCH: 26.5 pg — ABNORMAL LOW (ref 26.6–33.0)
MCHC: 32 g/dL (ref 31.5–35.7)
MCV: 83 fL (ref 79–97)
Monocytes Absolute: 0.6 10*3/uL (ref 0.1–0.9)
Monocytes: 11 %
Neutrophils Absolute: 3.3 10*3/uL (ref 1.4–7.0)
Neutrophils: 57 %
Platelets: 324 10*3/uL (ref 150–450)
RBC: 4.65 x10E6/uL (ref 3.77–5.28)
RDW: 14.8 % (ref 11.7–15.4)
WBC: 5.7 10*3/uL (ref 3.4–10.8)

## 2023-03-14 LAB — COMPREHENSIVE METABOLIC PANEL
ALT: 31 [IU]/L (ref 0–32)
AST: 33 [IU]/L (ref 0–40)
Albumin: 4.1 g/dL (ref 3.9–4.9)
Alkaline Phosphatase: 84 [IU]/L (ref 44–121)
BUN/Creatinine Ratio: 12 (ref 9–23)
BUN: 8 mg/dL (ref 6–24)
Bilirubin Total: 0.3 mg/dL (ref 0.0–1.2)
CO2: 20 mmol/L (ref 20–29)
Calcium: 9.4 mg/dL (ref 8.7–10.2)
Chloride: 104 mmol/L (ref 96–106)
Creatinine, Ser: 0.65 mg/dL (ref 0.57–1.00)
Globulin, Total: 3.4 g/dL (ref 1.5–4.5)
Glucose: 121 mg/dL — ABNORMAL HIGH (ref 70–99)
Potassium: 4 mmol/L (ref 3.5–5.2)
Sodium: 138 mmol/L (ref 134–144)
Total Protein: 7.5 g/dL (ref 6.0–8.5)
eGFR: 108 mL/min/{1.73_m2} (ref >59)

## 2023-03-14 LAB — LIPID PANEL W/O CHOL/HDL RATIO
Cholesterol, Total: 152 mg/dL (ref 100–199)
HDL: 47 mg/dL (ref >39)
LDL Chol Calc (NIH): 91 mg/dL (ref 0–99)
Triglycerides: 74 mg/dL (ref 0–149)
VLDL Cholesterol Cal: 14 mg/dL (ref 5–40)

## 2023-03-14 LAB — FERRITIN: Ferritin: 16 ng/mL (ref 15–150)

## 2023-03-14 LAB — IRON: Iron: 59 ug/dL (ref 27–159)

## 2023-03-14 NOTE — Progress Notes (Signed)
Contacted via MyChart   Good morning Daisy Ross, your labs have returned and overall these are stable. Kidney function, creatinine and eGFR, remains normal, as is liver function, AST and ALT. CBC is showing improvement in anemia as is iron level. Lipid panel is showing improved levels.  Any questions? Keep being amazing!!  Thank you for allowing me to participate in your care.  I appreciate you. Kindest regards, Arynn Armand

## 2023-06-17 NOTE — Patient Instructions (Signed)
Be Involved in Caring For Your Health:  Taking Medications When medications are taken as directed, they can greatly improve your health. But if they are not taken as prescribed, they may not work. In some cases, not taking them correctly can be harmful. To help ensure your treatment remains effective and safe, understand your medications and how to take them. Bring your medications to each visit for review by your provider.  Your lab results, notes, and after visit summary will be available on My Chart. We strongly encourage you to use this feature. If lab results are abnormal the clinic will contact you with the appropriate steps. If the clinic does not contact you assume the results are satisfactory. You can always view your results on My Chart. If you have questions regarding your health or results, please contact the clinic during office hours. You can also ask questions on My Chart.  We at [facility] are grateful that you chose [place] to provide your care. We strive to provide evidence-based and compassionate care and are always looking for feedback. If you get a survey from the clinic please complete this so we can hear your opinions.  Diabetes Mellitus and Exercise Regular exercise is important for your health, especially if you have diabetes mellitus. Exercise is not just about losing weight. It can also help you increase muscle strength and bone density and reduce body fat and stress. This can help your level of endurance and make you more fit and flexible. Why should I exercise if I have diabetes? Exercise has many benefits for people with diabetes. It can: Help lower and control your blood sugar (glucose). Help your body respond better and become more sensitive to the hormone insulin. Reduce how much insulin your body needs. Lower your risk for heart disease by: Lowering how much "bad" cholesterol and triglycerides you have in your body. Increasing how much "good" cholesterol  you have in your body. Lowering your blood pressure. Lowering your blood glucose levels. What is my activity plan? Your health care provider or an expert trained in diabetes care (certified diabetes educator) can help you make an activity plan. This plan can help you find the type of exercise that works for you. It may also tell you how often to exercise and for how long. Be sure to: Get at least 150 minutes of medium-intensity or high-intensity exercise each week. This may involve brisk walking, biking, or water aerobics. Do stretching and strengthening exercises at least 2 times a week. This may involve yoga or weight lifting. Spread out your activity over at least 3 days of the week. Get some form of physical activity each day. Do not go more than 2 days in a row without some kind of activity. Avoid being inactive for more than 30 minutes at a time. Take frequent breaks to walk or stretch. Choose activities that you enjoy. Set goals that you know you can accomplish. Start slowly and increase the intensity of your exercise over time. How do I manage my diabetes during exercise?  Monitor your blood glucose Check your blood glucose before and after you exercise. If your blood glucose is 240 mg/dL (40.9 mmol/L) or higher before you exercise, check your urine for ketones. These are chemicals created by the liver. If you have ketones in your urine, do not exercise until your blood glucose returns to normal. If your blood glucose is 100 mg/dL (5.6 mmol/L) or lower, eat a snack that has 15-20 grams of carbohydrate in  it. Check your blood glucose 15 minutes after the snack to make sure that your level is above 100 mg/dL (5.6 mmol/L) before you start to exercise. Your risk for low blood glucose (hypoglycemia) goes up during and after exercise. Know the symptoms of this condition and how to treat it. Follow these instructions at home: Keep a carbohydrate snack on hand for use before, during, and after  exercise. This can help prevent or treat hypoglycemia. Avoid injecting insulin into parts of your body that are going to be used during exercise. This may include: Your arms, when you are going to play tennis. Your legs, when you are about to go jogging. Keep track of your exercise habits. This can help you and your health care provider watch and adjust your activity plan. Write down: What you eat before and after you exercise. Blood glucose levels before and after you exercise. The type and amount of exercise you do. Talk to your health care provider before you start a new activity. They may need to: Make sure that the activity is safe for you. Adjust your insulin, other medicines, and food that you eat. Drink water while you exercise. This can stop you from losing too much water (dehydration). It can also prevent problems caused by having a lot of heat in your body (heat stroke). Where to find more information American Diabetes Association: diabetes.org Association of Diabetes Care & Education Specialists: diabeteseducator.org This information is not intended to replace advice given to you by your health care provider. Make sure you discuss any questions you have with your health care provider. Document Revised: 07/21/2021 Document Reviewed: 07/21/2021 Elsevier Patient Education  2024 [employer].

## 2023-06-19 ENCOUNTER — Encounter: Payer: Self-pay | Admitting: Nurse Practitioner

## 2023-06-19 ENCOUNTER — Ambulatory Visit (INDEPENDENT_AMBULATORY_CARE_PROVIDER_SITE_OTHER): Payer: Commercial Managed Care - PPO | Admitting: Nurse Practitioner

## 2023-06-19 VITALS — BP 120/76 | HR 53 | Ht 66.5 in | Wt 188.2 lb

## 2023-06-19 DIAGNOSIS — E785 Hyperlipidemia, unspecified: Secondary | ICD-10-CM

## 2023-06-19 DIAGNOSIS — E1169 Type 2 diabetes mellitus with other specified complication: Secondary | ICD-10-CM

## 2023-06-19 DIAGNOSIS — E1165 Type 2 diabetes mellitus with hyperglycemia: Secondary | ICD-10-CM

## 2023-06-19 DIAGNOSIS — D508 Other iron deficiency anemias: Secondary | ICD-10-CM | POA: Diagnosis not present

## 2023-06-19 DIAGNOSIS — Z9049 Acquired absence of other specified parts of digestive tract: Secondary | ICD-10-CM

## 2023-06-19 DIAGNOSIS — R7989 Other specified abnormal findings of blood chemistry: Secondary | ICD-10-CM

## 2023-06-19 LAB — BAYER DCA HB A1C WAIVED: HB A1C (BAYER DCA - WAIVED): 6.9 % — ABNORMAL HIGH (ref 4.8–5.6)

## 2023-06-19 NOTE — Assessment & Plan Note (Signed)
 Chronic, ongoing. Did not tolerate Ferrous Sulfate, nausea and GI issues.  Recheck labs.  Discussed with her considering referral to hematology for possible infusions or GYN to assist with heavier cycles in future if needed.

## 2023-06-19 NOTE — Assessment & Plan Note (Signed)
 Ongoing, will check lipid panel today and discuss statin in future once pain improving from her past surgery.

## 2023-06-19 NOTE — Assessment & Plan Note (Signed)
 Ongoing, initial diagnosis 06/02/22 with A1c 7.3%, today A1c 6.9%, slight trend down. She wishes to continue off Metformin  and work on diet and exercise.  Strong family history in females of this.  Educated on this medication use and side effects, may need to restart Metformin  in future.  Recommend heavy focus on diet changes at home.  Urine ALB 80 February 2025 -- would benefit a very low dose ARB/ACE in future.  Check CMP, Lipid. - No current statin or ACE/ARB, will further discuss future visits - Eye and foot exam up to date - Will discuss PCV20 future visit.

## 2023-06-19 NOTE — Progress Notes (Signed)
 BP 120/76 (BP Location: Left Arm, Patient Position: Sitting, Cuff Size: Normal)   Pulse (!) 53   Ht 5' 6.5" (1.689 m)   Wt 188 lb 3.2 oz (85.4 kg)   SpO2 97%   BMI 29.92 kg/m    Subjective:    Patient ID: Daisy Ross, female    DOB: 15-Aug-1975, 48 y.o.   MRN: [id number]  HPI: Daisy Ross is a 48 y.o. female  Chief Complaint  Patient presents with   Diabetes   DIABETES A1c 7.1% in January. She had taken Metformin  in past, but had to stop during a surgery and we kept her off of this to see if can maintain diet control. Hypoglycemic episodes:no Polydipsia/polyuria: no Visual disturbance: no Chest pain: no Paresthesias: no Glucose Monitoring: yes  Accucheck frequency: a few times a week -- 160 on average  Fasting glucose:   Post prandial:  Evening:  Before meals: Taking Insulin ?: no  Long acting insulin :  Short acting insulin : Blood Pressure Monitoring: not checking Retinal Examination: Up To Date 12/28/2022 [facility] Foot Exam: Up to Date Diabetic Education: Not Completed Pneumovax:  refuses Influenza:  refuses Aspirin: no   HYPERLIPIDEMIA No current statin therapy. Supplements: none Aspirin:  no The 10-year ASCVD risk score (Arnett DK, et al., 2019) is: 1.8%   Values used to calculate the score:     Age: 49 years     Sex: Female     Is Non-Hispanic African American: No     Diabetic: Yes     Tobacco smoker: No     Systolic Blood Pressure: 120 mmHg     Is BP treated: No     HDL Cholesterol: 47 mg/dL     Total Cholesterol: 152 mg/dL Chest pain:  no Coronary artery disease:  no Family history CAD:  no Family history early CAD:  no   ANEMIA No current supplement, was present after recent surgeries.  Continues to have ongoing abdominal discomfort and numbness to upper left leg post gall bladder removal in October 2024. Having heart burn with this too.  Has tried Gabapentin  and Amitriptyline .  They were going to give shot at one point with [facility], but it  got better.  But it is back now and nausea too. Anemia status: stable Etiology of anemia: recent surgeries with history of low levels Duration of anemia treatment: unknown Severity of anemia: mild Fatigue: no Decreased exercise tolerance: no  Dyspnea on exertion: no Palpitations: no Bleeding: only with recent surgeries Pica: no     06/19/2023   10:36 AM 03/13/2023    9:53 AM 01/23/2023   10:14 AM 12/12/2022    9:43 AM 10/24/2022   10:02 AM  Depression screen PHQ 2/9  Decreased Interest 0 2 2 2  0  Down, Depressed, Hopeless 0 0 0 2 0  PHQ - 2 Score 0 2 2 4  0  Altered sleeping 2 0 2 2 0  Tired, decreased energy  2 0 0 0  Change in appetite  0 1 0 0  Feeling bad or failure about yourself  0 0 0 2 0  Trouble concentrating 0 0 0 0 0  Moving slowly or fidgety/restless 0 1 2 3  0  Suicidal thoughts 0 0 0 0 0  PHQ-9 Score 2 5 7 11  0  Difficult doing work/chores Not difficult at all Not difficult at all Somewhat difficult  Not difficult at all       06/19/2023   10:37 AM 03/13/2023  9:54 AM 01/23/2023   10:14 AM 12/12/2022    9:42 AM  GAD 7 : Generalized Anxiety Score  Nervous, Anxious, on Edge 0 0 0 0  Control/stop worrying 0 0 2 2  Worry too much - different things 2 2 2  0  Trouble relaxing 2 2 2 2   Restless 2 2 2 2   Easily annoyed or irritable 0 0 0 0  Afraid - awful might happen 0 0 0 0  Total GAD 7 Score 6 6 8 6   Anxiety Difficulty Not difficult at all Not difficult at all Not difficult at all    Relevant past medical, surgical, family and social history reviewed and updated as indicated. Interim medical history since our last visit reviewed. Allergies and medications reviewed and updated.  Review of Systems  Constitutional:  Negative for activity change, appetite change, fatigue and fever.  Respiratory:  Negative for cough, chest tightness, shortness of breath and wheezing.   Cardiovascular:  Negative for chest pain, palpitations and leg swelling.  Gastrointestinal:  Negative.   Endocrine: Negative for polydipsia, polyphagia and polyuria.  Neurological: Negative.   Psychiatric/Behavioral: Negative.     Per HPI unless specifically indicated above     Objective:    BP 120/76 (BP Location: Left Arm, Patient Position: Sitting, Cuff Size: Normal)   Pulse (!) 53   Ht 5' 6.5" (1.689 m)   Wt 188 lb 3.2 oz (85.4 kg)   SpO2 97%   BMI 29.92 kg/m   Wt Readings from Last 3 Encounters:  06/19/23 188 lb 3.2 oz (85.4 kg)  03/13/23 187 lb 3.2 oz (84.9 kg)  01/23/23 191 lb 9.6 oz (86.9 kg)    Physical Exam Vitals and nursing note reviewed.  Constitutional:      General: She is awake. She is not in acute distress.    Appearance: She is well-developed and well-groomed. She is not ill-appearing or toxic-appearing.  HENT:     Head: Normocephalic.     Right Ear: Hearing and external ear normal.     Left Ear: Hearing and external ear normal.  Eyes:     General: Lids are normal.        Right eye: No discharge.        Left eye: No discharge.     Conjunctiva/sclera: Conjunctivae normal.     Pupils: Pupils are equal, round, and reactive to light.  Neck:     Thyroid: No thyromegaly.     Vascular: No carotid bruit.  Cardiovascular:     Rate and Rhythm: Normal rate and regular rhythm.     Heart sounds: Normal heart sounds. No murmur heard.    No gallop.  Pulmonary:     Effort: Pulmonary effort is normal. No accessory muscle usage or respiratory distress.     Breath sounds: Normal breath sounds.  Abdominal:     General: Bowel sounds are normal. There is no distension.     Palpations: Abdomen is soft.     Tenderness: There is no abdominal tenderness.  Musculoskeletal:     Cervical back: Normal range of motion and neck supple.     Right lower leg: No edema.     Left lower leg: No edema.  Lymphadenopathy:     Cervical: No cervical adenopathy.  Skin:    General: Skin is warm and dry.  Neurological:     Mental Status: She is alert and oriented to person,  place, and time.     Deep Tendon Reflexes: Reflexes are  normal and symmetric.     Reflex Scores:      Brachioradialis reflexes are 2+ on the right side and 2+ on the left side.      Patellar reflexes are 2+ on the right side and 2+ on the left side. Psychiatric:        Attention and Perception: Attention normal.        Mood and Affect: Mood normal.        Speech: Speech normal.        Behavior: Behavior normal. Behavior is cooperative.        Thought Content: Thought content normal.    Diabetic Foot Exam - Simple   Simple Foot Form Visual Inspection No deformities, no ulcerations, no other skin breakdown bilaterally: Yes Sensation Testing Intact to touch and monofilament testing bilaterally: Yes Pulse Check Posterior Tibialis and Dorsalis pulse intact bilaterally: Yes Comments     Results for orders placed or performed in visit on 03/13/23  Bayer DCA Hb A1c Waived   Collection Time: 03/13/23  9:53 AM  Result Value Ref Range   HB A1C (BAYER DCA - WAIVED) 7.1 (H) 4.8 - 5.6 %  Microalbumin, Urine Waived   Collection Time: 03/13/23  9:53 AM  Result Value Ref Range   Microalb, Ur Waived 80 (H) 0 - 19 mg/L   Creatinine, Urine Waived 300 10 - 300 mg/dL   Microalb/Creat Ratio 30-300 (H) <30 mg/g  Lipid Panel w/o Chol/HDL Ratio   Collection Time: 03/13/23  9:54 AM  Result Value Ref Range   Cholesterol, Total 152 100 - 199 mg/dL   Triglycerides 74 0 - 149 mg/dL   HDL 47 >16 mg/dL   VLDL Cholesterol Cal 14 5 - 40 mg/dL   LDL Chol Calc (NIH) 91 0 - 99 mg/dL  Comprehensive metabolic panel   Collection Time: 03/13/23  9:54 AM  Result Value Ref Range   Glucose 121 (H) 70 - 99 mg/dL   BUN 8 6 - 24 mg/dL   Creatinine, Ser 1.09 0.57 - 1.00 mg/dL   eGFR 604 >54 UJ/WJX/9.14   BUN/Creatinine Ratio 12 9 - 23   Sodium 138 134 - 144 mmol/L   Potassium 4.0 3.5 - 5.2 mmol/L   Chloride 104 96 - 106 mmol/L   CO2 20 20 - 29 mmol/L   Calcium 9.4 8.7 - 10.2 mg/dL   Total Protein 7.5 6.0 -  8.5 g/dL   Albumin 4.1 3.9 - 4.9 g/dL   Globulin, Total 3.4 1.5 - 4.5 g/dL   Bilirubin Total 0.3 0.0 - 1.2 mg/dL   Alkaline Phosphatase 84 44 - 121 IU/L   AST 33 0 - 40 IU/L   ALT 31 0 - 32 IU/L  Ferritin   Collection Time: 03/13/23  9:54 AM  Result Value Ref Range   Ferritin 16 15 - 150 ng/mL  Iron    Collection Time: 03/13/23  9:54 AM  Result Value Ref Range   Iron  59 27 - 159 ug/dL  CBC with Differential/Platelet   Collection Time: 03/13/23  9:54 AM  Result Value Ref Range   WBC 5.7 3.4 - 10.8 x10E3/uL   RBC 4.65 3.77 - 5.28 x10E6/uL   Hemoglobin 12.3 11.1 - 15.9 g/dL   Hematocrit 78.2 95.6 - 46.6 %   MCV 83 79 - 97 fL   MCH 26.5 (L) 26.6 - 33.0 pg   MCHC 32.0 31.5 - 35.7 g/dL   RDW 21.3 08.6 - 57.8 %   Platelets 324 150 -  450 x10E3/uL   Neutrophils 57 Not Estab. %   Lymphs 30 Not Estab. %   Monocytes 11 Not Estab. %   Eos 1 Not Estab. %   Basos 1 Not Estab. %   Neutrophils Absolute 3.3 1.4 - 7.0 x10E3/uL   Lymphocytes Absolute 1.7 0.7 - 3.1 x10E3/uL   Monocytes Absolute 0.6 0.1 - 0.9 x10E3/uL   EOS (ABSOLUTE) 0.1 0.0 - 0.4 x10E3/uL   Basophils Absolute 0.0 0.0 - 0.2 x10E3/uL   Immature Granulocytes 0 Not Estab. %   Immature Grans (Abs) 0.0 0.0 - 0.1 x10E3/uL      Assessment & Plan:   Problem List Items Addressed This Visit       Endocrine   Type 2 diabetes mellitus (HCC) - Primary   Ongoing, initial diagnosis 06/02/22 with A1c 7.3%, today A1c 6.9%, slight trend down. She wishes to continue off Metformin  and work on diet and exercise.  Strong family history in females of this.  Educated on this medication use and side effects, may need to restart Metformin  in future.  Recommend heavy focus on diet changes at home.  Urine ALB 80 February 2025 -- would benefit a very low dose ARB/ACE in future.  Check CMP, Lipid. - No current statin or ACE/ARB, will further discuss future visits - Eye and foot exam up to date - Will discuss PCV20 future visit.      Relevant Orders    Bayer DCA Hb A1c Waived   Hyperlipidemia associated with type 2 diabetes mellitus (HCC)   Ongoing, will check lipid panel today and discuss statin in future once pain improving from her past surgery.      Relevant Orders   Bayer DCA Hb A1c Waived   Comprehensive metabolic panel with GFR   Lipid Panel w/o Chol/HDL Ratio     Other   Iron  deficiency anemia   Chronic, ongoing. Did not tolerate Ferrous Sulfate , nausea and GI issues.  Recheck labs.  Discussed with her considering referral to hematology for possible infusions or GYN to assist with heavier cycles in future if needed.      History of cholecystectomy   Having ongoing pain to lower left abdomen.  Wishes to return to GI.  Placed referral to [facility] for patient for further assessment.  In past they had discussed steroid injections to area, discussed with her that if they determine need to do this then can send in short period of Metformin  to maintain BS.  Did not have benefit from Amitriptyline  or Gabapentin .      Relevant Orders   Ambulatory referral to Gastroenterology   Elevated LFTs   Ongoing and stable.  Noted on past labs, mild elevation in ALT.  Labs today to recheck and consider imaging if ongoing.        Follow up plan: Return in about 6 months (around 12/20/2023) for T2DM, HTN/HLD.

## 2023-06-19 NOTE — Assessment & Plan Note (Signed)
 Having ongoing pain to lower left abdomen.  Wishes to return to GI.  Placed referral to [facility] for patient for further assessment.  In past they had discussed steroid injections to area, discussed with her that if they determine need to do this then can send in short period of Metformin  to maintain BS.  Did not have benefit from Amitriptyline  or Gabapentin .

## 2023-06-19 NOTE — Assessment & Plan Note (Signed)
 Ongoing and stable.  Noted on past labs, mild elevation in ALT.  Labs today to recheck and consider imaging if ongoing.

## 2023-06-20 ENCOUNTER — Encounter: Payer: Self-pay | Admitting: Nurse Practitioner

## 2023-06-20 LAB — LIPID PANEL W/O CHOL/HDL RATIO
Cholesterol, Total: 170 mg/dL (ref 100–199)
HDL: 55 mg/dL (ref >39)
LDL Chol Calc (NIH): 93 mg/dL (ref 0–99)
Triglycerides: 125 mg/dL (ref 0–149)
VLDL Cholesterol Cal: 22 mg/dL (ref 5–40)

## 2023-06-20 LAB — COMPREHENSIVE METABOLIC PANEL WITH GFR
ALT: 46 IU/L — ABNORMAL HIGH (ref 0–32)
AST: 49 IU/L — ABNORMAL HIGH (ref 0–40)
Albumin: 4.4 g/dL (ref 3.9–4.9)
Alkaline Phosphatase: 87 IU/L (ref 44–121)
BUN/Creatinine Ratio: 10 (ref 9–23)
BUN: 7 mg/dL (ref 6–24)
Bilirubin Total: 0.3 mg/dL (ref 0.0–1.2)
CO2: 23 mmol/L (ref 20–29)
Calcium: 9.6 mg/dL (ref 8.7–10.2)
Chloride: 101 mmol/L (ref 96–106)
Creatinine, Ser: 0.69 mg/dL (ref 0.57–1.00)
Globulin, Total: 3 g/dL (ref 1.5–4.5)
Glucose: 109 mg/dL — ABNORMAL HIGH (ref 70–99)
Potassium: 4.4 mmol/L (ref 3.5–5.2)
Sodium: 137 mmol/L (ref 134–144)
Total Protein: 7.4 g/dL (ref 6.0–8.5)
eGFR: 106 mL/min/{1.73_m2} (ref >59)

## 2023-06-20 NOTE — Progress Notes (Signed)
 Contacted via MyChart   Good morning Daisy Ross, your labs have returned: - Kidney function, creatinine and eGFR, is normal.  Liver function, AST and ALT, has trended up slightly from previous check.  Mild elevations.  We will monitor closely. - Lipid panel is stable.  Any questions? Keep being amazing!!  Thank you for allowing me to participate in your care.  I appreciate you. Kindest regards, Tatsuya Okray

## 2023-08-05 ENCOUNTER — Other Ambulatory Visit: Payer: Self-pay | Admitting: Nurse Practitioner

## 2023-08-05 DIAGNOSIS — B009 Herpesviral infection, unspecified: Secondary | ICD-10-CM

## 2023-08-08 NOTE — Telephone Encounter (Signed)
 Requested medications are due for refill today.  Unsure  Requested medications are on the active medications list.  yes  Last refill. 01/24/2023  Future visit scheduled.   yes  Notes to clinic.  Medication is historical.    Requested Prescriptions  Pending Prescriptions Disp Refills   valACYclovir  (VALTREX ) 500 MG tablet [Pharmacy Med Name: VALACYCLOVIR  HCL 500 MG TABLET] 90 tablet 4    Sig: TAKE 1 TABLET (500 MG TOTAL) BY MOUTH DAILY.     Antimicrobials:  Antiviral Agents - Anti-Herpetic Passed - 08/08/2023 11:41 AM      Passed - Valid encounter within last 12 months    Recent Outpatient Visits           1 month ago Type 2 diabetes mellitus with hyperglycemia, without long-term current use of insulin  [facility])   [place] [facility] [place], Melanie DASEN, NP

## 2023-09-23 NOTE — Patient Instructions (Signed)
 Be Involved in Caring For Your Health:  Taking Medications When medications are taken as directed, they can greatly improve your health. But if they are not taken as prescribed, they may not work. In some cases, not taking them correctly can be harmful. To help ensure your treatment remains effective and safe, understand your medications and how to take them. Bring your medications to each visit for review by your provider.  Your lab results, notes, and after visit summary will be available on My Chart. We strongly encourage you to use this feature. If lab results are abnormal the clinic will contact you with the appropriate steps. If the clinic does not contact you assume the results are satisfactory. You can always view your results on My Chart. If you have questions regarding your health or results, please contact the clinic during office hours. You can also ask questions on My Chart.  We at [facility] are grateful that you chose [place] to provide your care. We strive to provide evidence-based and compassionate care and are always looking for feedback. If you get a survey from the clinic please complete this so we can hear your opinions.  Dizziness Dizziness is a common problem. It makes you feel unsteady or light-headed. You may feel like you're about to faint. Dizziness can lead to getting hurt if you stumble or fall. It's more common to feel dizzy if you're an older adult. Many things can cause you to feel dizzy. These include: Medicines. Dehydration. This is when there's not enough water in your body. Illness. Follow these instructions at home: Eating and drinking  Drink enough fluid to keep your pee (urine) pale yellow. This helps keep you from getting dehydrated. Try to drink more clear fluids, such as water. Do not drink alcohol. Try to limit how much caffeine you take in. Try to limit how much salt, also called sodium, you take in. Activity Try not to make quick  movements. Stand up slowly from sitting in a chair. Steady yourself until you feel okay. In the morning, first sit up on the side of the bed. When you feel okay, hold onto something and slowly stand up. Do this until you know that your balance is okay. If you need to stand in one place for a long time, move your legs often. Tighten and relax the muscles in your legs while you're standing. Do not drive or use machines if you feel dizzy. Avoid bending down if you feel dizzy. Place items in your home so you can reach them without leaning over. Lifestyle Do not smoke, vape, or use products with nicotine or tobacco in them. If you need help quitting, talk with your health care provider. Try to lower your stress level. You can do this by using methods like yoga or meditation. Talk with your provider if you need help. General instructions Watch your dizziness for any changes. Take your medicines only as told by your provider. Talk with your provider if you think you're dizzy because of a medicine you're taking. Tell a friend or a family member that you're feeling dizzy. If they spot any changes in your behavior, have them call your provider. Contact a health care provider if: Your dizziness doesn't go away, or you have new symptoms. Your dizziness gets worse. You feel like you may vomit. You have trouble hearing. You have a fever. You have neck pain or a stiff neck. You fall or get hurt. Get help right away if: You vomit  each time you eat or drink. You have watery poop and can't eat or drink. You have trouble talking, walking, swallowing, or using your arms, hands, or legs. You feel very weak. You're bleeding. You're not thinking clearly, or you have trouble forming sentences. A friend or family member may spot this. Your vision changes, or you get a very bad headache. These symptoms may be an emergency. Call 911 right away. Do not wait to see if the symptoms will go away. Do not drive  yourself to the hospital. This information is not intended to replace advice given to you by your health care provider. Make sure you discuss any questions you have with your health care provider. Document Revised: 11/03/2022 Document Reviewed: 03/17/2022 Elsevier Patient Education  2024 [employer].

## 2023-09-25 ENCOUNTER — Encounter: Payer: Self-pay | Admitting: Nurse Practitioner

## 2023-09-25 ENCOUNTER — Ambulatory Visit: Admitting: Nurse Practitioner

## 2023-09-25 VITALS — BP 119/75 | HR 65 | Temp 98.4°F | Ht 66.5 in | Wt 191.0 lb

## 2023-09-25 DIAGNOSIS — R42 Dizziness and giddiness: Secondary | ICD-10-CM | POA: Diagnosis not present

## 2023-09-25 MED ORDER — MECLIZINE HCL 50 MG PO TABS: 25.0000 mg | ORAL_TABLET | Freq: Three times a day (TID) | ORAL | 0 refills | Status: AC | PRN

## 2023-09-25 NOTE — Assessment & Plan Note (Signed)
 Ongoing for past month, intermittent.  Noted more with position changes.  ?more from orthostatic changes. Overall neuro exam reassuring today and EKG notes baseline sinus bradycardia.  Send in Meclizine  to take as needed for dizziness, educated her on this.  Labs obtained: CBC, iron , ferritin, B12, CMP, TSH.  Determine next steps after all labs have returned.  Return in 2 weeks.  If ongoing may need to consider imagining in future.

## 2023-09-25 NOTE — Progress Notes (Signed)
 BP 119/75   Pulse 65   Temp 98.4 F (36.9 C) (Oral)   Ht 5' 6.5 (1.689 m)   Wt 191 lb (86.6 kg)   LMP 09/21/2023 (Exact Date)   SpO2 98%   BMI 30.37 kg/m    Subjective:    Patient ID: Daisy Ross, female    DOB: 24-Nov-1975, 48 y.o.   MRN: [id number]  HPI: Daisy Ross is a 48 y.o. female  Chief Complaint  Patient presents with   Dizziness    Patient states she has been having off and on dizzy/lightheaded spells for the last month    DIZZINESS Been present on and off for the past month. No recent illnesses and sugars have been 130 to 140 range. Feels full in the head.  Will get hot with episodes.  Does have intermittent heart flutters still on occasion, did stress test which was reassuring in 2022.  She felt the Holter monitor did not read accurately as it kept falling off. Last cardiology visit was 05/18/20.  Reports good hydration at home and meal intake.  Saw GI last on 08/16/23 for her chronic abdominal pain, they started her on Bentyl .  She has been taking as needed. Duration: weeks Description of symptoms: ill-defined feels like could pass out Duration of episode: minutes Dizziness frequency: vertigo in the past with a concussion in the past Provoking factors: none Aggravating factors:  none Triggered by rolling over in bed: no Triggered by bending over: when goes to stand up from bending Aggravated by head movement: no Aggravated by exertion, coughing, loud noises: no Recent head injury: no Recent or current viral symptoms: no History of vasovagal episodes: no Nausea: no Vomiting: no Tinnitus: no Hearing loss: no Aural fullness: no Headache: no Photophobia/phonophobia: no Unsteady gait: on occasion Postural instability: no Diplopia, dysarthria, dysphagia or weakness: no Related to exertion: no Pallor: no Diaphoresis: no Dyspnea: no Chest pain: no   Relevant past medical, surgical, family and social history reviewed and updated as indicated. Interim  medical history since our last visit reviewed. Allergies and medications reviewed and updated.  Review of Systems  Constitutional:  Negative for activity change, appetite change, fatigue and fever.  Respiratory:  Negative for cough, chest tightness, shortness of breath and wheezing.   Cardiovascular:  Positive for palpitations. Negative for chest pain and leg swelling.  Gastrointestinal: Negative.   Endocrine: Negative for polydipsia, polyphagia and polyuria.  Neurological:  Positive for dizziness. Negative for seizures, syncope, facial asymmetry, speech difficulty, weakness, numbness and headaches.  Psychiatric/Behavioral: Negative.      Per HPI unless specifically indicated above     Objective:    BP 119/75   Pulse 65   Temp 98.4 F (36.9 C) (Oral)   Ht 5' 6.5 (1.689 m)   Wt 191 lb (86.6 kg)   LMP 09/21/2023 (Exact Date)   SpO2 98%   BMI 30.37 kg/m   Wt Readings from Last 3 Encounters:  09/25/23 191 lb (86.6 kg)  06/19/23 188 lb 3.2 oz (85.4 kg)  03/13/23 187 lb 3.2 oz (84.9 kg)    Physical Exam Vitals and nursing note reviewed.  Constitutional:      General: She is awake. She is not in acute distress.    Appearance: She is well-developed and well-groomed. She is obese. She is not ill-appearing or toxic-appearing.  HENT:     Head: Normocephalic.     Right Ear: Hearing, tympanic membrane, ear canal and external ear normal.  Left Ear: Hearing, tympanic membrane, ear canal and external ear normal.     Nose: Nose normal.     Mouth/Throat:     Mouth: Mucous membranes are moist.     Pharynx: No pharyngeal swelling, oropharyngeal exudate or posterior oropharyngeal erythema.  Eyes:     General: Lids are normal.        Right eye: No discharge.        Left eye: No discharge.     Extraocular Movements: Extraocular movements intact.     Conjunctiva/sclera: Conjunctivae normal.     Pupils: Pupils are equal, round, and reactive to light.     Visual Fields: Right eye  visual fields normal and left eye visual fields normal.  Neck:     Thyroid: No thyromegaly.     Vascular: No carotid bruit.  Cardiovascular:     Rate and Rhythm: Normal rate and regular rhythm.     Heart sounds: Normal heart sounds. No murmur heard.    No gallop.  Pulmonary:     Effort: Pulmonary effort is normal. No accessory muscle usage or respiratory distress.     Breath sounds: Normal breath sounds.  Abdominal:     General: Bowel sounds are normal. There is no distension.     Palpations: Abdomen is soft.     Tenderness: There is no abdominal tenderness.  Musculoskeletal:     Cervical back: Normal range of motion and neck supple.     Right lower leg: No edema.     Left lower leg: No edema.  Lymphadenopathy:     Cervical: No cervical adenopathy.  Skin:    General: Skin is warm and dry.  Neurological:     Mental Status: She is alert and oriented to person, place, and time.     Cranial Nerves: Cranial nerves 2-12 are intact.     Motor: Motor function is intact. No weakness.     Coordination: Coordination is intact. Romberg sign negative. Heel to [facility] Test normal.     Gait: Gait is intact.     Deep Tendon Reflexes: Reflexes are normal and symmetric.     Reflex Scores:      Brachioradialis reflexes are 2+ on the right side and 2+ on the left side.      Patellar reflexes are 2+ on the right side and 2+ on the left side.    Comments: No dizziness with lying down and turning head side to side.  Dizziness was noted upon sitting up from lying down on the table.  Psychiatric:        Attention and Perception: Attention normal.        Mood and Affect: Mood normal.        Speech: Speech normal.        Behavior: Behavior normal. Behavior is cooperative.        Thought Content: Thought content normal.   EKG My review and personal interpretation at Time: 9:13   Indication: dizziness  Rate: 58  Rhythm: sinus bradycardia  Axis: normal Other: No nonspecific st abn, no stemi, no  lvh  Results for orders placed or performed in visit on 06/19/23  Bayer DCA Hb A1c Waived   Collection Time: 06/19/23 10:46 AM  Result Value Ref Range   HB A1C (BAYER DCA - WAIVED) 6.9 (H) 4.8 - 5.6 %  Comprehensive metabolic panel with GFR   Collection Time: 06/19/23 10:46 AM  Result Value Ref Range   Glucose 109 (H) 70 - 99  mg/dL   BUN 7 6 - 24 mg/dL   Creatinine, Ser 9.30 0.57 - 1.00 mg/dL   eGFR 893 >40 fO/fpw/8.26   BUN/Creatinine Ratio 10 9 - 23   Sodium 137 134 - 144 mmol/L   Potassium 4.4 3.5 - 5.2 mmol/L   Chloride 101 96 - 106 mmol/L   CO2 23 20 - 29 mmol/L   Calcium 9.6 8.7 - 10.2 mg/dL   Total Protein 7.4 6.0 - 8.5 g/dL   Albumin 4.4 3.9 - 4.9 g/dL   Globulin, Total 3.0 1.5 - 4.5 g/dL   Bilirubin Total 0.3 0.0 - 1.2 mg/dL   Alkaline Phosphatase 87 44 - 121 IU/L   AST 49 (H) 0 - 40 IU/L   ALT 46 (H) 0 - 32 IU/L  Lipid Panel w/o Chol/HDL Ratio   Collection Time: 06/19/23 10:46 AM  Result Value Ref Range   Cholesterol, Total 170 100 - 199 mg/dL   Triglycerides 874 0 - 149 mg/dL   HDL 55 >60 mg/dL   VLDL Cholesterol Cal 22 5 - 40 mg/dL   LDL Chol Calc (NIH) 93 0 - 99 mg/dL      Assessment & Plan:   Problem List Items Addressed This Visit       Other   Dizziness - Primary   Ongoing for past month, intermittent.  Noted more with position changes.  ?more from orthostatic changes. Overall neuro exam reassuring today and EKG notes baseline sinus bradycardia.  Send in Meclizine  to take as needed for dizziness, educated her on this.  Labs obtained: CBC, iron , ferritin, B12, CMP, TSH.  Determine next steps after all labs have returned.  Return in 2 weeks.  If ongoing may need to consider imagining in future.      Relevant Orders   CBC with Differential/Platelet   Comprehensive metabolic panel with GFR   TSH   Ferritin   Iron    Vitamin B12   EKG 12-Lead    Time: 25 minutes, >50% spent counseling/or care coordination   Follow up plan: Return in about 2 weeks  (around 10/09/2023) for Dizziness.

## 2023-09-26 ENCOUNTER — Ambulatory Visit: Payer: Self-pay | Admitting: Nurse Practitioner

## 2023-09-26 LAB — CBC WITH DIFFERENTIAL/PLATELET
Basophils Absolute: 0.1 x10E3/uL (ref 0.0–0.2)
Basos: 1 %
EOS (ABSOLUTE): 0.1 x10E3/uL (ref 0.0–0.4)
Eos: 1 %
Hematocrit: 38.1 % (ref 34.0–46.6)
Hemoglobin: 12 g/dL (ref 11.1–15.9)
Immature Grans (Abs): 0 x10E3/uL (ref 0.0–0.1)
Immature Granulocytes: 0 %
Lymphocytes Absolute: 1.7 x10E3/uL (ref 0.7–3.1)
Lymphs: 31 %
MCH: 27.5 pg (ref 26.6–33.0)
MCHC: 31.5 g/dL (ref 31.5–35.7)
MCV: 87 fL (ref 79–97)
Monocytes Absolute: 0.6 x10E3/uL (ref 0.1–0.9)
Monocytes: 12 %
Neutrophils Absolute: 3 x10E3/uL (ref 1.4–7.0)
Neutrophils: 55 %
Platelets: 311 x10E3/uL (ref 150–450)
RBC: 4.37 x10E6/uL (ref 3.77–5.28)
RDW: 13.6 % (ref 11.7–15.4)
WBC: 5.5 x10E3/uL (ref 3.4–10.8)

## 2023-09-26 LAB — COMPREHENSIVE METABOLIC PANEL WITH GFR
ALT: 27 IU/L (ref 0–32)
AST: 29 IU/L (ref 0–40)
Albumin: 4.2 g/dL (ref 3.9–4.9)
Alkaline Phosphatase: 98 IU/L (ref 44–121)
BUN/Creatinine Ratio: 17 (ref 9–23)
BUN: 11 mg/dL (ref 6–24)
Bilirubin Total: 0.3 mg/dL (ref 0.0–1.2)
CO2: 20 mmol/L (ref 20–29)
Calcium: 9.1 mg/dL (ref 8.7–10.2)
Chloride: 105 mmol/L (ref 96–106)
Creatinine, Ser: 0.66 mg/dL (ref 0.57–1.00)
Globulin, Total: 3.1 g/dL (ref 1.5–4.5)
Glucose: 128 mg/dL — ABNORMAL HIGH (ref 70–99)
Potassium: 4.1 mmol/L (ref 3.5–5.2)
Sodium: 139 mmol/L (ref 134–144)
Total Protein: 7.3 g/dL (ref 6.0–8.5)
eGFR: 107 mL/min/1.73 (ref >59)

## 2023-09-26 LAB — FERRITIN: Ferritin: 17 ng/mL (ref 15–150)

## 2023-09-26 LAB — VITAMIN B12: Vitamin B-12: 460 pg/mL (ref 232–1245)

## 2023-09-26 LAB — TSH: TSH: 1.55 u[IU]/mL (ref 0.450–4.500)

## 2023-09-26 LAB — IRON: Iron: 60 ug/dL (ref 27–159)

## 2023-09-26 NOTE — Progress Notes (Signed)
 Contacted via MyChart  Good morning Brad, current labs overall look great. No concerns on these.  If you continue to have dizziness we may need to get imaging at future visit.  Any questions? Keep being stellar!!  Thank you for allowing me to participate in your care.  I appreciate you. Kindest regards, Kiev Labrosse

## 2023-10-15 NOTE — Patient Instructions (Incomplete)
 Be Involved in Caring For Your Health:  Taking Medications When medications are taken as directed, they can greatly improve your health. But if they are not taken as prescribed, they may not work. In some cases, not taking them correctly can be harmful. To help ensure your treatment remains effective and safe, understand your medications and how to take them. Bring your medications to each visit for review by your provider.  Your lab results, notes, and after visit summary will be available on My Chart. We strongly encourage you to use this feature. If lab results are abnormal the clinic will contact you with the appropriate steps. If the clinic does not contact you assume the results are satisfactory. You can always view your results on My Chart. If you have questions regarding your health or results, please contact the clinic during office hours. You can also ask questions on My Chart.  We at [facility] are grateful that you chose [place] to provide your care. We strive to provide evidence-based and compassionate care and are always looking for feedback. If you get a survey from the clinic please complete this so we can hear your opinions.  Dizziness Dizziness is a common problem. It makes you feel unsteady or light-headed. You may feel like you're about to faint. Dizziness can lead to getting hurt if you stumble or fall. It's more common to feel dizzy if you're an older adult. Many things can cause you to feel dizzy. These include: Medicines. Dehydration. This is when there's not enough water in your body. Illness. Follow these instructions at home: Eating and drinking  Drink enough fluid to keep your pee (urine) pale yellow. This helps keep you from getting dehydrated. Try to drink more clear fluids, such as water. Do not drink alcohol. Try to limit how much caffeine you take in. Try to limit how much salt, also called sodium, you take in. Activity Try not to make quick  movements. Stand up slowly from sitting in a chair. Steady yourself until you feel okay. In the morning, first sit up on the side of the bed. When you feel okay, hold onto something and slowly stand up. Do this until you know that your balance is okay. If you need to stand in one place for a long time, move your legs often. Tighten and relax the muscles in your legs while you're standing. Do not drive or use machines if you feel dizzy. Avoid bending down if you feel dizzy. Place items in your home so you can reach them without leaning over. Lifestyle Do not smoke, vape, or use products with nicotine or tobacco in them. If you need help quitting, talk with your health care provider. Try to lower your stress level. You can do this by using methods like yoga or meditation. Talk with your provider if you need help. General instructions Watch your dizziness for any changes. Take your medicines only as told by your provider. Talk with your provider if you think you're dizzy because of a medicine you're taking. Tell a friend or a family member that you're feeling dizzy. If they spot any changes in your behavior, have them call your provider. Contact a health care provider if: Your dizziness doesn't go away, or you have new symptoms. Your dizziness gets worse. You feel like you may vomit. You have trouble hearing. You have a fever. You have neck pain or a stiff neck. You fall or get hurt. Get help right away if: You vomit  each time you eat or drink. You have watery poop and can't eat or drink. You have trouble talking, walking, swallowing, or using your arms, hands, or legs. You feel very weak. You're bleeding. You're not thinking clearly, or you have trouble forming sentences. A friend or family member may spot this. Your vision changes, or you get a very bad headache. These symptoms may be an emergency. Call 911 right away. Do not wait to see if the symptoms will go away. Do not drive  yourself to the hospital. This information is not intended to replace advice given to you by your health care provider. Make sure you discuss any questions you have with your health care provider. Document Revised: 11/03/2022 Document Reviewed: 03/17/2022 Elsevier Patient Education  2024 [employer].

## 2023-10-17 ENCOUNTER — Ambulatory Visit: Admitting: Nurse Practitioner

## 2023-11-20 LAB — HM COLONOSCOPY

## 2023-11-25 NOTE — Patient Instructions (Incomplete)
 Be Involved in Caring For Your Health:  Taking Medications When medications are taken as directed, they can greatly improve your health. But if they are not taken as prescribed, they may not work. In some cases, not taking them correctly can be harmful. To help ensure your treatment remains effective and safe, understand your medications and how to take them. Bring your medications to each visit for review by your provider.  Your lab results, notes, and after visit summary will be available on My Chart. We strongly encourage you to use this feature. If lab results are abnormal the clinic will contact you with the appropriate steps. If the clinic does not contact you assume the results are satisfactory. You can always view your results on My Chart. If you have questions regarding your health or results, please contact the clinic during office hours. You can also ask questions on My Chart.  We at [facility] are grateful that you chose [place] to provide your care. We strive to provide evidence-based and compassionate care and are always looking for feedback. If you get a survey from the clinic please complete this so we can hear your opinions.  Dizziness Dizziness is a common problem. It makes you feel unsteady or light-headed. You may feel like you're about to faint. Dizziness can lead to getting hurt if you stumble or fall. It's more common to feel dizzy if you're an older adult. Many things can cause you to feel dizzy. These include: Medicines. Dehydration. This is when there's not enough water in your body. Illness. Follow these instructions at home: Eating and drinking  Drink enough fluid to keep your pee (urine) pale yellow. This helps keep you from getting dehydrated. Try to drink more clear fluids, such as water. Do not drink alcohol. Try to limit how much caffeine you take in. Try to limit how much salt, also called sodium, you take in. Activity Try not to make quick  movements. Stand up slowly from sitting in a chair. Steady yourself until you feel okay. In the morning, first sit up on the side of the bed. When you feel okay, hold onto something and slowly stand up. Do this until you know that your balance is okay. If you need to stand in one place for a long time, move your legs often. Tighten and relax the muscles in your legs while you're standing. Do not drive or use machines if you feel dizzy. Avoid bending down if you feel dizzy. Place items in your home so you can reach them without leaning over. Lifestyle Do not smoke, vape, or use products with nicotine or tobacco in them. If you need help quitting, talk with your health care provider. Try to lower your stress level. You can do this by using methods like yoga or meditation. Talk with your provider if you need help. General instructions Watch your dizziness for any changes. Take your medicines only as told by your provider. Talk with your provider if you think you're dizzy because of a medicine you're taking. Tell a friend or a family member that you're feeling dizzy. If they spot any changes in your behavior, have them call your provider. Contact a health care provider if: Your dizziness doesn't go away, or you have new symptoms. Your dizziness gets worse. You feel like you may vomit. You have trouble hearing. You have a fever. You have neck pain or a stiff neck. You fall or get hurt. Get help right away if: You vomit  each time you eat or drink. You have watery poop and can't eat or drink. You have trouble talking, walking, swallowing, or using your arms, hands, or legs. You feel very weak. You're bleeding. You're not thinking clearly, or you have trouble forming sentences. A friend or family member may spot this. Your vision changes, or you get a very bad headache. These symptoms may be an emergency. Call 911 right away. Do not wait to see if the symptoms will go away. Do not drive  yourself to the hospital. This information is not intended to replace advice given to you by your health care provider. Make sure you discuss any questions you have with your health care provider. Document Revised: 11/03/2022 Document Reviewed: 03/17/2022 Elsevier Patient Education  2024 [employer].

## 2023-11-27 ENCOUNTER — Ambulatory Visit: Admitting: Nurse Practitioner

## 2023-12-16 NOTE — Patient Instructions (Incomplete)

## 2023-12-18 ENCOUNTER — Ambulatory Visit: Admitting: Nurse Practitioner

## 2023-12-18 DIAGNOSIS — E1165 Type 2 diabetes mellitus with hyperglycemia: Secondary | ICD-10-CM

## 2023-12-18 DIAGNOSIS — D508 Other iron deficiency anemias: Secondary | ICD-10-CM

## 2023-12-18 DIAGNOSIS — E785 Hyperlipidemia, unspecified: Secondary | ICD-10-CM

## 2023-12-23 NOTE — Patient Instructions (Incomplete)

## 2023-12-25 ENCOUNTER — Ambulatory Visit: Admitting: Nurse Practitioner

## 2023-12-25 DIAGNOSIS — R001 Bradycardia, unspecified: Secondary | ICD-10-CM

## 2023-12-25 DIAGNOSIS — E1169 Type 2 diabetes mellitus with other specified complication: Secondary | ICD-10-CM

## 2023-12-25 DIAGNOSIS — D508 Other iron deficiency anemias: Secondary | ICD-10-CM

## 2023-12-25 DIAGNOSIS — E1165 Type 2 diabetes mellitus with hyperglycemia: Secondary | ICD-10-CM

## 2023-12-25 DIAGNOSIS — R7989 Other specified abnormal findings of blood chemistry: Secondary | ICD-10-CM
